# Patient Record
Sex: Female | Born: 1945 | Race: Black or African American | Hispanic: No | Marital: Married | Smoking: Never smoker
Health system: Southern US, Community
[De-identification: ages and names within clinical notes are randomized; demographics above are authoritative.]

## PROBLEM LIST (undated history)

## (undated) DIAGNOSIS — M1990 Unspecified osteoarthritis, unspecified site: Secondary | ICD-10-CM

## (undated) DIAGNOSIS — B009 Herpesviral infection, unspecified: Secondary | ICD-10-CM

## (undated) DIAGNOSIS — M8580 Other specified disorders of bone density and structure, unspecified site: Secondary | ICD-10-CM

## (undated) DIAGNOSIS — K219 Gastro-esophageal reflux disease without esophagitis: Secondary | ICD-10-CM

## (undated) DIAGNOSIS — T7840XA Allergy, unspecified, initial encounter: Secondary | ICD-10-CM

## (undated) DIAGNOSIS — R011 Cardiac murmur, unspecified: Secondary | ICD-10-CM

## (undated) DIAGNOSIS — H269 Unspecified cataract: Secondary | ICD-10-CM

## (undated) DIAGNOSIS — N870 Mild cervical dysplasia: Secondary | ICD-10-CM

## (undated) DIAGNOSIS — D219 Benign neoplasm of connective and other soft tissue, unspecified: Secondary | ICD-10-CM

## (undated) DIAGNOSIS — D649 Anemia, unspecified: Secondary | ICD-10-CM

## (undated) HISTORY — DX: Unspecified cataract: H26.9

## (undated) HISTORY — DX: Gastro-esophageal reflux disease without esophagitis: K21.9

## (undated) HISTORY — DX: Cardiac murmur, unspecified: R01.1

## (undated) HISTORY — DX: Allergy, unspecified, initial encounter: T78.40XA

## (undated) HISTORY — DX: Herpesviral infection, unspecified: B00.9

## (undated) HISTORY — DX: Mild cervical dysplasia: N87.0

## (undated) HISTORY — DX: Other specified disorders of bone density and structure, unspecified site: M85.80

## (undated) HISTORY — PX: GYNECOLOGIC CRYOSURGERY: SHX857

## (undated) HISTORY — PX: TUBAL LIGATION: SHX77

## (undated) HISTORY — DX: Anemia, unspecified: D64.9

## (undated) HISTORY — DX: Unspecified osteoarthritis, unspecified site: M19.90

## (undated) HISTORY — DX: Benign neoplasm of connective and other soft tissue, unspecified: D21.9

## (undated) HISTORY — PX: POLYPECTOMY: SHX149

## (undated) HISTORY — PX: ENDOMETRIAL ABLATION: SHX621

## (undated) HISTORY — PX: COLPOSCOPY: SHX161

## (undated) HISTORY — PX: COLONOSCOPY: SHX174

---

## 1991-02-06 HISTORY — PX: ABDOMINAL HYSTERECTOMY: SHX81

## 1998-04-16 ENCOUNTER — Other Ambulatory Visit: Admission: RE | Admit: 1998-04-16 | Discharge: 1998-04-16 | Payer: Self-pay | Admitting: Obstetrics and Gynecology

## 1999-05-20 ENCOUNTER — Other Ambulatory Visit: Admission: RE | Admit: 1999-05-20 | Discharge: 1999-05-20 | Payer: Self-pay | Admitting: Obstetrics and Gynecology

## 2000-08-14 ENCOUNTER — Encounter: Admission: RE | Admit: 2000-08-14 | Discharge: 2000-08-14 | Payer: Self-pay | Admitting: Oncology

## 2000-08-14 ENCOUNTER — Encounter (HOSPITAL_COMMUNITY): Admission: RE | Admit: 2000-08-14 | Discharge: 2000-09-13 | Payer: Self-pay | Admitting: Oncology

## 2000-11-28 ENCOUNTER — Encounter: Admission: RE | Admit: 2000-11-28 | Discharge: 2000-11-28 | Payer: Self-pay | Admitting: Oncology

## 2000-11-28 ENCOUNTER — Encounter (HOSPITAL_COMMUNITY): Admission: RE | Admit: 2000-11-28 | Discharge: 2000-12-28 | Payer: Self-pay | Admitting: Oncology

## 2000-12-14 ENCOUNTER — Other Ambulatory Visit: Admission: RE | Admit: 2000-12-14 | Discharge: 2000-12-14 | Payer: Self-pay | Admitting: Obstetrics and Gynecology

## 2001-07-02 ENCOUNTER — Encounter (HOSPITAL_COMMUNITY): Admission: RE | Admit: 2001-07-02 | Discharge: 2001-08-01 | Payer: Self-pay | Admitting: Oncology

## 2001-07-02 ENCOUNTER — Encounter: Admission: RE | Admit: 2001-07-02 | Discharge: 2001-07-02 | Payer: Self-pay | Admitting: Oncology

## 2002-01-20 ENCOUNTER — Other Ambulatory Visit: Admission: RE | Admit: 2002-01-20 | Discharge: 2002-01-20 | Payer: Self-pay | Admitting: Obstetrics and Gynecology

## 2002-04-28 ENCOUNTER — Encounter (HOSPITAL_COMMUNITY): Admission: RE | Admit: 2002-04-28 | Discharge: 2002-05-28 | Payer: Self-pay | Admitting: Oncology

## 2002-04-28 ENCOUNTER — Encounter: Admission: RE | Admit: 2002-04-28 | Discharge: 2002-04-28 | Payer: Self-pay | Admitting: Oncology

## 2002-10-13 ENCOUNTER — Ambulatory Visit (HOSPITAL_COMMUNITY): Admission: RE | Admit: 2002-10-13 | Discharge: 2002-10-13 | Payer: Self-pay | Admitting: Gastroenterology

## 2002-10-13 ENCOUNTER — Encounter (INDEPENDENT_AMBULATORY_CARE_PROVIDER_SITE_OTHER): Payer: Self-pay | Admitting: Specialist

## 2003-02-20 ENCOUNTER — Other Ambulatory Visit: Admission: RE | Admit: 2003-02-20 | Discharge: 2003-02-20 | Payer: Self-pay | Admitting: Obstetrics and Gynecology

## 2003-11-30 ENCOUNTER — Encounter: Admission: RE | Admit: 2003-11-30 | Discharge: 2003-11-30 | Payer: Self-pay | Admitting: Oncology

## 2003-11-30 ENCOUNTER — Encounter (HOSPITAL_COMMUNITY): Admission: RE | Admit: 2003-11-30 | Discharge: 2003-12-30 | Payer: Self-pay | Admitting: Oncology

## 2004-03-08 DIAGNOSIS — B009 Herpesviral infection, unspecified: Secondary | ICD-10-CM

## 2004-03-08 HISTORY — DX: Herpesviral infection, unspecified: B00.9

## 2004-04-18 ENCOUNTER — Other Ambulatory Visit: Admission: RE | Admit: 2004-04-18 | Discharge: 2004-04-18 | Payer: Self-pay | Admitting: Obstetrics and Gynecology

## 2005-05-05 ENCOUNTER — Other Ambulatory Visit: Admission: RE | Admit: 2005-05-05 | Discharge: 2005-05-05 | Payer: Self-pay | Admitting: Obstetrics and Gynecology

## 2006-06-29 ENCOUNTER — Other Ambulatory Visit: Admission: RE | Admit: 2006-06-29 | Discharge: 2006-06-29 | Payer: Self-pay | Admitting: Obstetrics and Gynecology

## 2007-07-26 ENCOUNTER — Other Ambulatory Visit: Admission: RE | Admit: 2007-07-26 | Discharge: 2007-07-26 | Payer: Self-pay | Admitting: Obstetrics and Gynecology

## 2009-06-08 ENCOUNTER — Ambulatory Visit: Payer: Self-pay | Admitting: Obstetrics and Gynecology

## 2009-06-08 ENCOUNTER — Other Ambulatory Visit: Admission: RE | Admit: 2009-06-08 | Discharge: 2009-06-08 | Payer: Self-pay | Admitting: Obstetrics and Gynecology

## 2009-08-10 ENCOUNTER — Ambulatory Visit: Payer: Self-pay | Admitting: Obstetrics and Gynecology

## 2009-08-26 ENCOUNTER — Emergency Department (HOSPITAL_COMMUNITY): Admission: EM | Admit: 2009-08-26 | Discharge: 2009-08-26 | Payer: Self-pay | Admitting: Emergency Medicine

## 2010-08-31 ENCOUNTER — Other Ambulatory Visit: Payer: Self-pay | Admitting: Obstetrics and Gynecology

## 2010-08-31 ENCOUNTER — Other Ambulatory Visit (HOSPITAL_COMMUNITY)
Admission: RE | Admit: 2010-08-31 | Discharge: 2010-08-31 | Disposition: A | Discharge status: Home / Self Care | Payer: BC Managed Care – PPO | Source: Ambulatory Visit | Attending: Obstetrics and Gynecology | Admitting: Obstetrics and Gynecology

## 2010-08-31 ENCOUNTER — Encounter (INDEPENDENT_AMBULATORY_CARE_PROVIDER_SITE_OTHER): Payer: BC Managed Care – PPO | Admitting: Obstetrics and Gynecology

## 2010-08-31 DIAGNOSIS — Z124 Encounter for screening for malignant neoplasm of cervix: Secondary | ICD-10-CM | POA: Insufficient documentation

## 2010-08-31 DIAGNOSIS — Z01419 Encounter for gynecological examination (general) (routine) without abnormal findings: Secondary | ICD-10-CM

## 2010-09-23 NOTE — Op Note (Signed)
   NAME:  Latasha Schmidt, Latasha Schmidt                          ACCOUNT NO.:  [id number]   MEDICAL RECORD NO.:  [id number]                   PATIENT TYPE:  AMB   LOCATION:  ENDO                                 FACILITY:  [facility]   PHYSICIAN:  Petra Kuba, M.D.                 DATE OF BIRTH:  03-Dec-1945   DATE OF PROCEDURE:  10/13/2002  DATE OF DISCHARGE:                                 OPERATIVE REPORT   PROCEDURE:  Colonoscopy with polypectomy.   INDICATIONS FOR PROCEDURE:  Screening with family history of colon cancer  and colon polyps.  Consent was signed after risks, benefits, methods, and  options were thoroughly discussed in the office.   PREMEDICATION:  Demerol 50 and Versed 5.   DESCRIPTION OF PROCEDURE:  Rectal inspection was pertinent for external  hemorrhoids.  Small digital examination was negative.  Video pediatric  adjustable colonoscope was inserted, fairly easily advanced around the colon  to the cecum.  This did require some abdominal pressure, but no position  changes.  No obvious abnormality was seen on insertion.  The cecum was  identified by the appendiceal orifice and the ileocecal valve.  Prep was  fairly adequate.  Did require some washing and suctioning for adequate  visualization.  On slow withdrawal through the colon, other than a tiny  small cecal and mid descending polyps, both of which were hot biopsied, no  other abnormalities were seen as we slowly withdrew back to the rectum.  Once back in the rectum, the scope was retroflexed pertinent for some  internal hemorrhoids.  Anal rectal palpation confirmed the above.  The scope  was straightened and readvanced a short ways up the left side of the colon.  Air was suctioned and the scope removed.  The patient tolerated the  procedure well. There was no obvious immediate complications.   ENDOSCOPIC ASSESSMENT:  1. Internal and external hemorrhoids.  2. Two tiny and small cecum and descending polyps hot biopsied.  3.  Otherwise within normal limits to the cecum.   PLAN:  Happy to see back p.r.n.  Return care to Dr. Christell Constant for the customary  health care maintenance to include yearly rectals and guaiacs.  Await  pathology.  Probably repeat colon screening in five years unless a surprise  on pathology.                                               Petra Kuba, M.D.    MEM/MEDQ  D:  10/13/2002  T:  10/13/2002  Job:  [id number]   cc:   Ernestina Penna, M.D.  [address] [place]  [place] [address]  Fax: [phone]

## 2010-10-21 ENCOUNTER — Ambulatory Visit (INDEPENDENT_AMBULATORY_CARE_PROVIDER_SITE_OTHER): Payer: BC Managed Care – PPO | Admitting: Women's Health

## 2010-10-21 DIAGNOSIS — B373 Candidiasis of vulva and vagina: Secondary | ICD-10-CM

## 2010-10-21 DIAGNOSIS — N898 Other specified noninflammatory disorders of vagina: Secondary | ICD-10-CM

## 2011-06-12 ENCOUNTER — Encounter: Payer: Self-pay | Admitting: Obstetrics and Gynecology

## 2011-06-22 ENCOUNTER — Ambulatory Visit (AMBULATORY_SURGERY_CENTER): Payer: BC Managed Care – PPO | Admitting: *Deleted

## 2011-06-22 ENCOUNTER — Telehealth: Payer: Self-pay | Admitting: *Deleted

## 2011-06-22 ENCOUNTER — Encounter: Payer: Self-pay | Admitting: Internal Medicine

## 2011-06-22 VITALS — Ht 62.0 in | Wt 129.9 lb

## 2011-06-22 DIAGNOSIS — Z1211 Encounter for screening for malignant neoplasm of colon: Secondary | ICD-10-CM

## 2011-06-22 MED ORDER — PEG-KCL-NACL-NASULF-NA ASC-C 100 G PO SOLR: ORAL | Status: DC

## 2011-06-22 NOTE — Progress Notes (Signed)
No allergy to eggs or soy products  Pt very anxious at her PV.  She states that she has severe asthma and had a bad experience with her uterine ablation.  Pt informed of Propofol and she is agreeable to this.  Appt changed to Dr. Lauro Franklin propofol day

## 2011-06-22 NOTE — Telephone Encounter (Signed)
Dicky Doe,  I put a release of information sheet on your computer for this pt.  She had her last colonoscopy with Dr. Ewing Schlein in 2004.  Her colonoscopy is scheduled for 07-05-11.  Thank you, Baxter Hire

## 2011-06-27 ENCOUNTER — Other Ambulatory Visit: Payer: BC Managed Care – PPO | Admitting: Internal Medicine

## 2011-07-05 ENCOUNTER — Encounter: Payer: Self-pay | Admitting: Internal Medicine

## 2011-07-05 ENCOUNTER — Ambulatory Visit (AMBULATORY_SURGERY_CENTER): Payer: BC Managed Care – PPO | Admitting: Internal Medicine

## 2011-07-05 VITALS — BP 137/70 | HR 85 | Temp 97.7°F | Resp 18 | Ht 62.0 in | Wt 129.0 lb

## 2011-07-05 DIAGNOSIS — Z1211 Encounter for screening for malignant neoplasm of colon: Secondary | ICD-10-CM

## 2011-07-05 DIAGNOSIS — Z860100 Personal history of colon polyps, unspecified: Secondary | ICD-10-CM

## 2011-07-05 MED ORDER — SODIUM CHLORIDE 0.9 % IV SOLN: 500.0000 mL | INTRAVENOUS | Status: DC

## 2011-07-05 NOTE — Progress Notes (Signed)
Patient did not experience any of the following events: a burn prior to discharge; a fall within the facility; wrong site/side/patient/procedure/implant event; or a hospital transfer or hospital admission upon discharge from the facility. (G8907) Patient did not have preoperative order for IV antibiotic SSI prophylaxis. (G8918)  

## 2011-07-05 NOTE — Patient Instructions (Signed)

## 2011-07-05 NOTE — Op Note (Signed)
[facility] [address] [place], [place]  [address]  COLONOSCOPY PROCEDURE REPORT  PATIENT:  Latasha Schmidt, Latasha Schmidt  MR#:  [id number] BIRTHDATE:  12/09/1945, 66 yrs. old  GENDER:  female ENDOSCOPIST:  Carie Caddy. Laretta Pyatt, MD REF. BY:  Paulita Cradle, N.P. PROCEDURE DATE:  07/05/2011 PROCEDURE:  Colonoscopy 62130 ASA CLASS:  Class II INDICATIONS:  surveillance and high-risk screening, history of colon polyps, last colonoscopy approximately 8 yrs ago MEDICATIONS:   MAC sedation, administered by CRNA, propofol (Diprivan) 200 mg IV  DESCRIPTION OF PROCEDURE:   After the risks benefits and alternatives of the procedure were thoroughly explained, informed consent was obtained.  Digital rectal exam was performed and revealed no rectal masses.   The LB [id number] endoscope was introduced through the anus and advanced to the cecum, which was identified by both the appendix and ileocecal valve, without limitations.  The quality of the prep was good, using MoviPrep. The instrument was then slowly withdrawn as the colon was fully examined. <<PROCEDUREIMAGES>>  FINDINGS:  A small lipoma was found in the descending colon.  This was otherwise a normal examination of the colon.   Retroflexed views in the rectum revealed no abnormalities.   The scope was then withdrawn  from the cecum and the procedure completed. COMPLICATIONS:  None  ENDOSCOPIC IMPRESSION: 1) Small lipoma in the descending colon. 2) Otherwise normal examination  RECOMMENDATIONS: 1) Given your personal history of adenomatous (pre-cancerous) polyps, you will need a repeat colonoscopy in 5 years.  Carie Caddy. Rhea Belton, MD  CC:  The Patient Paulita Cradle, NP  n. eSIGNED:   Carie Caddy. Jayleena Stille at 07/05/2011 12:06 PM  Eustace Quail, [phone]

## 2011-07-06 ENCOUNTER — Telehealth: Payer: Self-pay | Admitting: *Deleted

## 2011-07-06 NOTE — Telephone Encounter (Signed)
  Follow up Call-  Call back number 07/05/2011  Post procedure Call Back phone  # [phone]  Permission to leave phone message Yes     Patient questions:  Do you have a fever, pain , or abdominal swelling? yes Pain Score  2 *  Have you tolerated food without any problems? yes  Have you been able to return to your normal activities? yes  Do you have any questions about your discharge instructions: Diet   no Medications  no Follow up visit  no  Do you have questions or concerns about your Care? no  Actions: * If pain score is 4 or above: No action needed, pain <4.  Instructed pt to call back if pains worsens or if she develops any new symptoms.

## 2011-08-14 DIAGNOSIS — B009 Herpesviral infection, unspecified: Secondary | ICD-10-CM | POA: Insufficient documentation

## 2011-08-14 DIAGNOSIS — M8580 Other specified disorders of bone density and structure, unspecified site: Secondary | ICD-10-CM | POA: Insufficient documentation

## 2011-08-15 ENCOUNTER — Other Ambulatory Visit: Payer: Self-pay | Admitting: Obstetrics and Gynecology

## 2011-08-15 DIAGNOSIS — M8580 Other specified disorders of bone density and structure, unspecified site: Secondary | ICD-10-CM

## 2011-08-17 ENCOUNTER — Ambulatory Visit (INDEPENDENT_AMBULATORY_CARE_PROVIDER_SITE_OTHER): Payer: BC Managed Care – PPO

## 2011-08-17 DIAGNOSIS — M949 Disorder of cartilage, unspecified: Secondary | ICD-10-CM

## 2011-08-17 DIAGNOSIS — M8580 Other specified disorders of bone density and structure, unspecified site: Secondary | ICD-10-CM

## 2011-09-04 ENCOUNTER — Encounter: Payer: BC Managed Care – PPO | Admitting: Obstetrics and Gynecology

## 2011-09-21 ENCOUNTER — Encounter: Payer: Self-pay | Admitting: Obstetrics and Gynecology

## 2011-09-21 ENCOUNTER — Ambulatory Visit (INDEPENDENT_AMBULATORY_CARE_PROVIDER_SITE_OTHER): Payer: BC Managed Care – PPO | Admitting: Obstetrics and Gynecology

## 2011-09-21 VITALS — BP 120/80 | Ht 63.0 in | Wt 132.0 lb

## 2011-09-21 DIAGNOSIS — Z01419 Encounter for gynecological examination (general) (routine) without abnormal findings: Secondary | ICD-10-CM

## 2011-09-21 DIAGNOSIS — N870 Mild cervical dysplasia: Secondary | ICD-10-CM | POA: Insufficient documentation

## 2011-09-21 NOTE — Progress Notes (Signed)
Patient came to see me today for her annual GYN exam. She is doing fine without HRT. She is having no vaginal bleeding. She is having no pelvic pain or dyspareunia. 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 She had a normal colonoscopy this year. She scheduled her yearly mammogram. She does her lab through her PCP. She was just started on a statin. 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 She has had normal Pap smears since then and a normal cervix at the time of her hysterectomy. She just had a bone density through our office. She has low bone mass without an elevated FRAX risk. She takes calcium and vitamin D. She has had no fractures.  HEENT: Within normal limits. Latasha Schmidt present. Neck: No masses. Supraclavicular lymph nodes: Not enlarged. Breasts: Examined in both sitting and lying position. Symmetrical without skin changes or masses. Abdomen: Soft no masses guarding or rebound. No hernias. Pelvic: External within normal limits. BUS within normal limits. Vaginal examination shows good estrogen effect, no cystocele enterocele or rectocele. Cervix and uterus absent. Adnexa within normal limits. Rectovaginal confirmatory. Extremities within normal limits.  Assessment: #1. CIN-1 #2. Osteopenia  Plan: Mammogram

## 2011-09-22 LAB — URINALYSIS W MICROSCOPIC + REFLEX CULTURE
Casts: NONE SEEN
Crystals: NONE SEEN
Leukocytes, UA: NEGATIVE
Nitrite: NEGATIVE
Specific Gravity, Urine: >1.03 — ABNORMAL HIGH (ref 1.005–1.030)
pH: 5.5 (ref 5.0–8.0)

## 2011-10-05 ENCOUNTER — Encounter: Payer: Self-pay | Admitting: Obstetrics and Gynecology

## 2011-10-11 ENCOUNTER — Telehealth: Payer: Self-pay | Admitting: *Deleted

## 2011-10-11 MED ORDER — ACYCLOVIR 5 % EX OINT: TOPICAL_OINTMENT | CUTANEOUS | Status: DC

## 2011-10-11 MED ORDER — VALACYCLOVIR HCL 500 MG PO TABS: ORAL_TABLET | ORAL | Status: DC

## 2011-10-11 NOTE — Telephone Encounter (Signed)
Addended by: Aura Camps on: 10/11/2011 10:44 AM   Modules accepted: Orders

## 2011-10-11 NOTE — Telephone Encounter (Signed)
Pt calling c/o of possible HSV 1 outbreak, first outbreak in years. Pt said about 4 years ago you gave her rx for cream and pill to take. She would both this time as well, she also mentioned if her husband could take some medication? He is a diabetic and she is concerned about him. Please advise

## 2011-10-11 NOTE — Telephone Encounter (Signed)
Pt informed with the below note, rx sent to pharmacy. 

## 2011-10-11 NOTE — Telephone Encounter (Signed)
Given her Valtrex 500 mg twice a day for 5 days. You can also give her Zovirax ointment which she can rub on 4-5 times a day. Her husband's Dr. Laury Axon to be one to treat him.

## 2011-10-23 ENCOUNTER — Ambulatory Visit (INDEPENDENT_AMBULATORY_CARE_PROVIDER_SITE_OTHER): Payer: BC Managed Care – PPO | Admitting: Obstetrics and Gynecology

## 2011-10-23 ENCOUNTER — Encounter: Payer: Self-pay | Admitting: Obstetrics and Gynecology

## 2011-10-23 DIAGNOSIS — N9089 Other specified noninflammatory disorders of vulva and perineum: Secondary | ICD-10-CM

## 2011-10-23 DIAGNOSIS — B009 Herpesviral infection, unspecified: Secondary | ICD-10-CM

## 2011-10-23 MED ORDER — FLUCONAZOLE 150 MG PO TABS: 150.0000 mg | ORAL_TABLET | Freq: Once | ORAL | Status: AC

## 2011-10-23 NOTE — Progress Notes (Signed)
Patient came to see me today with her husband because a concern of giving him HSV -1. 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 We have treated her with Valtrex for one year and she never got a recurrence so we stopped it in 2006. She had no recurrences after stopping it but on June 5 of this year she developed a red lesion near her clitoris and thought it was herpes. We offered to see her but she declined an ask for treatment which we gave her in the form of Valtrex twice a day. She still has some evidence of a red bump and came in to see me to look at it and also to discuss what she and her husband should do to prevent him from getting herpes. They have been together 16 months and have always had unprotected sex and he has never had an outbreak nor does he have a history of herpes.  Exam: Latasha Schmidt present. External: The right side of her clitoris there is shows the slightest presence of what looks like folliculitis( 1-2 mm) but does not look like herpes. Vaginal exam: Within normal limits  Assessment: History of HSV 1. Small area folliculitis( 1-2 mm)  Plan: Patient reassured that this is not a herpetic outbreak. Diflucan called in when necessary. Discussed in detail with patient and her husband wanted transpired in 2005 and why I treated her for one year then. We discussed in detail whether she should go on suppressive Valtrex to prevent her from shedding virus so he would not get infected. We also discussed condoms. I told them that I thought it is extremely unlikely that she would give him herpes at this point but certainly there is no guarantee. They will think about it and she would like to go on daily Valtrex she will call for an prescription and will give her 500 mg daily.

## 2011-11-02 ENCOUNTER — Ambulatory Visit (INDEPENDENT_AMBULATORY_CARE_PROVIDER_SITE_OTHER): Payer: BC Managed Care – PPO | Admitting: Obstetrics and Gynecology

## 2011-11-02 DIAGNOSIS — N899 Noninflammatory disorder of vagina, unspecified: Secondary | ICD-10-CM

## 2011-11-02 DIAGNOSIS — R300 Dysuria: Secondary | ICD-10-CM

## 2011-11-02 DIAGNOSIS — N898 Other specified noninflammatory disorders of vagina: Secondary | ICD-10-CM

## 2011-11-02 LAB — URINALYSIS W MICROSCOPIC + REFLEX CULTURE
Casts: NONE SEEN
Crystals: NONE SEEN
Glucose, UA: NEGATIVE mg/dL
Hgb urine dipstick: NEGATIVE
Nitrite: NEGATIVE
Specific Gravity, Urine: 1.025 (ref 1.005–1.030)
pH: 5.5 (ref 5.0–8.0)

## 2011-11-02 LAB — WET PREP FOR TRICH, YEAST, CLUE

## 2011-11-02 MED ORDER — TERCONAZOLE 0.8 % VA CREA: 1.0000 | TOPICAL_CREAM | Freq: Every day | VAGINAL | Status: AC

## 2011-11-02 NOTE — Progress Notes (Signed)
Patient came to see me today with a 3 to four-day history of vulvar and vaginal burning, discharge and burning after urination. She has not been on antibiotics. She has had no vaginal bleeding.  Exam: Kennon Portela present. External: Within normal limits BUS: Within normal limits. The area of folliculitis seen at the last visit is now gone. Vaginal exam shows a typical yeast white discharge. Wet prep was negative except for white blood cells and bacteria. Urinalysis showed 3-6 white cells.  Assessment: Yeast vaginitis. Possible urinary tract infection.  Plan: Terconazole 3 cream-use externally and internally. Urine culture.

## 2011-11-05 LAB — URINE CULTURE: Colony Count: 40000

## 2011-11-06 ENCOUNTER — Other Ambulatory Visit: Payer: Self-pay | Admitting: Obstetrics and Gynecology

## 2011-12-06 ENCOUNTER — Other Ambulatory Visit: Payer: Self-pay | Admitting: Obstetrics and Gynecology

## 2012-07-22 DIAGNOSIS — J45909 Unspecified asthma, uncomplicated: Secondary | ICD-10-CM | POA: Diagnosis not present

## 2012-07-22 DIAGNOSIS — R0789 Other chest pain: Secondary | ICD-10-CM | POA: Diagnosis not present

## 2012-07-22 DIAGNOSIS — E785 Hyperlipidemia, unspecified: Secondary | ICD-10-CM | POA: Diagnosis not present

## 2012-07-22 DIAGNOSIS — R5381 Other malaise: Secondary | ICD-10-CM | POA: Diagnosis not present

## 2012-07-25 ENCOUNTER — Telehealth: Payer: Self-pay | Admitting: Nurse Practitioner

## 2012-07-25 NOTE — Telephone Encounter (Signed)
Insurance will pay for pravastatin sodiam 40 mg. Also  proair hfa 90 inhaler.  [facility] in [place]

## 2012-07-25 NOTE — Telephone Encounter (Signed)
Returning call on test results and she wants to talk about meds that ins want cover

## 2012-07-26 ENCOUNTER — Telehealth: Payer: Self-pay | Admitting: Nurse Practitioner

## 2012-07-26 DIAGNOSIS — H25019 Cortical age-related cataract, unspecified eye: Secondary | ICD-10-CM | POA: Diagnosis not present

## 2012-07-26 DIAGNOSIS — H524 Presbyopia: Secondary | ICD-10-CM | POA: Diagnosis not present

## 2012-07-26 DIAGNOSIS — H52229 Regular astigmatism, unspecified eye: Secondary | ICD-10-CM | POA: Diagnosis not present

## 2012-07-26 DIAGNOSIS — H5210 Myopia, unspecified eye: Secondary | ICD-10-CM | POA: Diagnosis not present

## 2012-07-26 NOTE — Telephone Encounter (Signed)
Rx for Proair called to [facility] in [place]. Pt notified

## 2012-07-26 NOTE — Telephone Encounter (Signed)
Patient called stating that her insurance will not cover the albuterol but they will cover the ProAir. She would like the ProAir to be called in to the [facility] in [place].

## 2012-07-29 ENCOUNTER — Other Ambulatory Visit: Payer: Self-pay | Admitting: Nurse Practitioner

## 2012-07-29 ENCOUNTER — Telehealth: Payer: Self-pay | Admitting: Nurse Practitioner

## 2012-07-29 ENCOUNTER — Encounter: Payer: Self-pay | Admitting: Nurse Practitioner

## 2012-07-29 MED ORDER — ALBUTEROL SULFATE HFA 108 (90 BASE) MCG/ACT IN AERS: 2.0000 | INHALATION_SPRAY | Freq: Four times a day (QID) | RESPIRATORY_TRACT | Status: DC | PRN

## 2012-07-29 NOTE — Telephone Encounter (Signed)
Insurance will not pay for allbuterol.. Wants proair. [facility] in [place]

## 2012-07-29 NOTE — Telephone Encounter (Signed)
Please advise 

## 2012-07-31 ENCOUNTER — Other Ambulatory Visit: Payer: Self-pay | Admitting: *Deleted

## 2012-07-31 ENCOUNTER — Encounter: Payer: Self-pay | Admitting: Nurse Practitioner

## 2012-07-31 NOTE — Addendum Note (Signed)
Addended by: Baltazar Apo on: 07/31/2012 10:25 AM   Modules accepted: Level of Service, SmartSet

## 2012-07-31 NOTE — Telephone Encounter (Signed)
  This encounter was created in error - please disregard.PT DECIDED NOT TO GE MEDICINE

## 2012-09-19 NOTE — Telephone Encounter (Signed)
rx sent to pharmacy on 07/25/12 per Bennie Pierini

## 2012-10-07 DIAGNOSIS — Z1231 Encounter for screening mammogram for malignant neoplasm of breast: Secondary | ICD-10-CM | POA: Diagnosis not present

## 2012-10-08 ENCOUNTER — Encounter: Payer: Self-pay | Admitting: Women's Health

## 2012-10-11 ENCOUNTER — Encounter: Payer: Self-pay | Admitting: Women's Health

## 2012-10-11 ENCOUNTER — Encounter: Payer: Self-pay | Admitting: Obstetrics and Gynecology

## 2012-10-11 ENCOUNTER — Ambulatory Visit (INDEPENDENT_AMBULATORY_CARE_PROVIDER_SITE_OTHER): Payer: Medicare Other | Admitting: Women's Health

## 2012-10-11 VITALS — BP 144/90 | Ht 63.75 in | Wt 138.0 lb

## 2012-10-11 DIAGNOSIS — B009 Herpesviral infection, unspecified: Secondary | ICD-10-CM

## 2012-10-11 MED ORDER — VALACYCLOVIR HCL 500 MG PO TABS: ORAL_TABLET | ORAL | Status: DC

## 2012-10-11 NOTE — Progress Notes (Signed)
Latasha Schmidt. Latasha Schmidt Jun 19, 1945 [id number]    History:    The patient presents for breast and pelvic exam. TAH in 89 for fibroids and menorrhagia, no HRT. history of CIN with cryo- in 67 with normal Paps after. Primary care manages anemia/asthma/hypercholesteremia. Started on a statin 2013. Normal mammogram history. DEXA 08/2011 - Osteopenia T score -1.9 at left femoral neck, FRAX 7.3%/1.1%, no fracture history. Negative colonoscopy 2013.   Past medical history, past surgical history, family history and social history were all reviewed and documented in the EPIC chart. Retired Science writer. Had been divorced for many years and remarried first husband. Mother colon cancer.  Exam:  Filed Vitals:   10/11/12 1037  BP: 144/90    General appearance:  Normal Head/Neck:  Normal, without cervical or supraclavicular adenopathy. Thyroid:  Symmetrical, normal in size, without palpable masses or nodularity. Respiratory  Effort:  Normal  Auscultation:  Clear without wheezing or rhonchi Cardiovascular  Auscultation:  Regular rate, without rubs, murmurs or gallops  Edema/varicosities:  Not grossly evident Abdominal  Soft,nontender, without masses, guarding or rebound.  Liver/spleen:  No organomegaly noted  Hernia:  None appreciated  Skin  Inspection:  Grossly normal  Palpation:  Grossly normal Neurologic/psychiatric  Orientation:  Normal with appropriate conversation.  Mood/affect:  Normal  Genitourinary    Breasts: Examined lying and sitting.     Right: Without masses, retractions, discharge or axillary adenopathy.     Left: Without masses, retractions, discharge or axillary adenopathy.   Inguinal/mons:  Normal without inguinal adenopathy  External genitalia:  Normal  BUS/Urethra/Skene's glands:  Normal  Bladder:  Normal  Vagina:  Normal  Cervix:  absent  Uterus: absent  Adnexa/parametria:     Rt: Without masses or tenderness.   Lt: Without masses or tenderness.  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 Vitamin D 2000 daily and calcium rich diet encouraged. SBE's, continue annual mammogram. Pneumovax and zostavac vaccines reviewed and encouraged. Valtrex 500 daily for suppression, reviewed may want to take twice daily for 3-5 days as needed. Reviewed blood pressure slightly elevated, instructed to check away from office and continues greater than 130/80 to followup with primary care.    Harrington Challenger WHNP, 1:06 PM 10/11/2012

## 2012-10-11 NOTE — Patient Instructions (Addendum)
Tdap, zostavac/shingles, pnuemonia vaccine Vit D 2000 daily  Health Recommendations for Postmenopausal Women Respected and ongoing research has looked at the most common causes of death, disability, and poor quality of life in postmenopausal women. The causes include heart disease, diseases of blood vessels, diabetes, depression, cancer, and bone loss (osteoporosis). Many things can be done to help lower the chances of developing these and other common problems: CARDIOVASCULAR DISEASE Heart Disease: A heart attack is a medical emergency. Know the signs and symptoms of a heart attack. Below are things women can do to reduce their risk for heart disease.   Do not smoke. If you smoke, quit.  Aim for a healthy weight. Being overweight causes many preventable deaths. Eat a healthy and balanced diet and drink an adequate amount of liquids.  Get moving. Make a commitment to be more physically active. Aim for 30 minutes of activity on most, if not all days of the week.  Eat for heart health. Choose a diet that is low in saturated fat and cholesterol and eliminate trans fat. Include whole grains, vegetables, and fruits. Read and understand the labels on food containers before buying.  Know your numbers. Ask your caregiver to check your blood pressure, cholesterol (total, HDL, LDL, triglycerides) and blood glucose. Work with your caregiver on improving your entire clinical picture.  High blood pressure. Limit or stop your table salt intake (try salt substitute and food seasonings). Avoid salty foods and drinks. Read labels on food containers before buying. Eating well and exercising can help control high blood pressure. STROKE  Stroke is a medical emergency. Stroke may be the result of a blood clot in a blood vessel in the brain or by a brain hemorrhage (bleeding). Know the signs and symptoms of a stroke. To lower the risk of developing a stroke:  Avoid fatty foods.  Quit smoking.  Control your  diabetes, blood pressure, and irregular heart rate. THROMBOPHLEBITIS (BLOOD CLOT) OF THE LEG  Becoming overweight and leading a stationary lifestyle may also contribute to developing blood clots. Controlling your diet and exercising will help lower the risk of developing blood clots. CANCER SCREENING  Breast Cancer: Take steps to reduce your risk of breast cancer.  You should practice "breast self-awareness." This means understanding the normal appearance and feel of your breasts and should include breast self-examination. Any changes detected, no matter how small, should be reported to your caregiver.  After age 71, you should have a clinical breast exam (CBE) every year.  Starting at age 60, you should consider having a mammogram (breast X-ray) every year.  If you have a family history of breast cancer, talk to your caregiver about genetic screening.  If you are at high risk for breast cancer, talk to your caregiver about having an MRI and a mammogram every year.  Intestinal or Stomach Cancer: Tests to consider are a rectal exam, fecal occult blood, sigmoidoscopy, and colonoscopy. Women who are high risk may need to be screened at an earlier age and more often.  Cervical Cancer:  Beginning at age 20, you should have a Pap test every 3 years as long as the past 3 Pap tests have been normal.  If you have had past treatment for cervical cancer or a condition that could lead to cancer, you need Pap tests and screening for cancer for at least 20 years after your treatment.  If you had a hysterectomy for a problem that was not cancer or a condition that could lead to  cancer, then you no longer need Pap tests.  If you are between ages 46 and 70, and you have had normal Pap tests going back 10 years, you no longer need Pap tests.  If Pap tests have been discontinued, risk factors (such as a new sexual partner) need to be reassessed to determine if screening should be resumed.  Some medical  problems can increase the chance of getting cervical cancer. In these cases, your caregiver may recommend more frequent screening and Pap tests.  Uterine Cancer: If you have vaginal bleeding after reaching menopause, you should notify your caregiver.  Ovarian cancer: Other than yearly pelvic exams, there are no reliable tests available to screen for ovarian cancer at this time except for yearly pelvic exams.  Lung Cancer: Yearly chest X-rays can detect lung cancer and should be done on high risk women, such as cigarette smokers and women with chronic lung disease (emphysema).  Skin Cancer: A complete body skin exam should be done at your yearly examination. Avoid overexposure to the sun and ultraviolet light lamps. Use a strong sun block cream when in the sun. All of these things are important in lowering the risk of skin cancer. MENOPAUSE Menopause Symptoms: Hormone therapy products are effective for treating symptoms associated with menopause:  Moderate to severe hot flashes.  Night sweats.  Mood swings.  Headaches.  Tiredness.  Loss of sex drive.  Insomnia.  Other symptoms. Hormone replacement carries certain risks, especially in older women. Women who use or are thinking about using estrogen or estrogen with progestin treatments should discuss that with their caregiver. Your caregiver will help you understand the benefits and risks. The ideal dose of hormone replacement therapy is not known. The Food and Drug Administration (FDA) has concluded that hormone therapy should be used only at the lowest doses and for the shortest amount of time to reach treatment goals.  OSTEOPOROSIS Protecting Against Bone Loss and Preventing Fracture: If you use hormone therapy for prevention of bone loss (osteoporosis), the risks for bone loss must outweigh the risk of the therapy. Ask your caregiver about other medications known to be safe and effective for preventing bone loss and fractures. To guard  against bone loss or fractures, the following is recommended:  If you are less than age 43, take 1000 mg of calcium and at least 600 mg of Vitamin D per day.  If you are greater than age 7 but less than age 84, take 1200 mg of calcium and at least 600 mg of Vitamin D per day.  If you are greater than age 43, take 1200 mg of calcium and at least 800 mg of Vitamin D per day. Smoking and excessive alcohol intake increases the risk of osteoporosis. Eat foods rich in calcium and vitamin D and do weight bearing exercises several times a week as your caregiver suggests. DIABETES Diabetes Melitus: If you have Type I or Type 2 diabetes, you should keep your blood sugar under control with diet, exercise and recommended medication. Avoid too many sweets, starchy and fatty foods. Being overweight can make control more difficult. COGNITION AND MEMORY Cognition and Memory: Menopausal hormone therapy is not recommended for the prevention of cognitive disorders such as Alzheimer's disease or memory loss.  DEPRESSION  Depression may occur at any age, but is common in elderly women. The reasons may be because of physical, medical, social (loneliness), or financial problems and needs. If you are experiencing depression because of medical problems and control of symptoms,  talk to your caregiver about this. Physical activity and exercise may help with mood and sleep. Community and volunteer involvement may help your sense of value and worth. If you have depression and you feel that the problem is getting worse or becoming severe, talk to your caregiver about treatment options that are best for you. ACCIDENTS  Accidents are common and can be serious in the elderly woman. Prepare your house to prevent accidents. Eliminate throw rugs, place hand bars in the bath, shower and toilet areas. Avoid wearing high heeled shoes or walking on wet, snowy, and icy areas. Limit or stop driving if you have vision or hearing problems, or  you feel you are unsteady with you movements and reflexes. HEPATITIS C Hepatitis C is a type of viral infection affecting the liver. It is spread mainly through contact with blood from an infected person. It can be treated, but if left untreated, it can lead to severe liver damage over years. Many people who are infected do not know that the virus is in their blood. If you are a "baby-boomer", it is recommended that you have one screening test for Hepatitis C. IMMUNIZATIONS  Several immunizations are important to consider having during your senior years, including:   Tetanus, diptheria, and pertussis booster shot.  Influenza every year before the flu season begins.  Pneumonia vaccine.  Shingles vaccine.  Others as indicated based on your specific needs. Talk to your caregiver about these. Document Released: 06/16/2005 Document Revised: 04/10/2012 Document Reviewed: 02/10/2008 [facility] Patient Information 2014 [place], [place].

## 2012-10-21 ENCOUNTER — Encounter: Payer: Self-pay | Admitting: Obstetrics and Gynecology

## 2012-11-06 ENCOUNTER — Other Ambulatory Visit: Payer: Self-pay | Admitting: Nurse Practitioner

## 2013-02-19 ENCOUNTER — Telehealth: Payer: Self-pay | Admitting: Nurse Practitioner

## 2013-02-20 NOTE — Telephone Encounter (Signed)
Okay to wait until get labs back to get refill

## 2013-02-21 MED ORDER — PRAVASTATIN SODIUM 40 MG PO TABS: 40.0000 mg | ORAL_TABLET | Freq: Every day | ORAL | Status: DC

## 2013-02-21 NOTE — Telephone Encounter (Signed)
Will refill X1

## 2013-02-21 NOTE — Telephone Encounter (Signed)
She has not had her lab work yet want have it till the end of november

## 2013-03-14 ENCOUNTER — Ambulatory Visit: Payer: Medicare Other | Admitting: Nurse Practitioner

## 2013-03-21 ENCOUNTER — Encounter: Payer: Self-pay | Admitting: Nurse Practitioner

## 2013-03-21 ENCOUNTER — Ambulatory Visit (INDEPENDENT_AMBULATORY_CARE_PROVIDER_SITE_OTHER): Payer: Medicare Other | Admitting: Nurse Practitioner

## 2013-03-21 ENCOUNTER — Encounter (INDEPENDENT_AMBULATORY_CARE_PROVIDER_SITE_OTHER): Payer: Self-pay

## 2013-03-21 ENCOUNTER — Telehealth: Payer: Self-pay | Admitting: Family Medicine

## 2013-03-21 VITALS — BP 132/72 | HR 68 | Temp 98.6°F | Ht 63.75 in | Wt 125.3 lb

## 2013-03-21 DIAGNOSIS — J45909 Unspecified asthma, uncomplicated: Secondary | ICD-10-CM | POA: Insufficient documentation

## 2013-03-21 DIAGNOSIS — K219 Gastro-esophageal reflux disease without esophagitis: Secondary | ICD-10-CM | POA: Insufficient documentation

## 2013-03-21 DIAGNOSIS — E785 Hyperlipidemia, unspecified: Secondary | ICD-10-CM | POA: Diagnosis not present

## 2013-03-21 LAB — POCT CBC
HCT, POC: 37.9 % (ref 37.7–47.9)
Hemoglobin: 12 g/dL — AB (ref 12.2–16.2)
Lymph, poc: 1.6 (ref 0.6–3.4)
MCH, POC: 26.9 pg — AB (ref 27–31.2)
MCHC: 31.8 g/dL (ref 31.8–35.4)
MCV: 84.8 fL (ref 80–97)
POC LYMPH PERCENT: 39.8 %L (ref 10–50)
Platelet Count, POC: 233 10*3/uL (ref 142–424)
RBC: 4.5 M/uL (ref 4.04–5.48)

## 2013-03-21 MED ORDER — ALBUTEROL SULFATE HFA 108 (90 BASE) MCG/ACT IN AERS: 2.0000 | INHALATION_SPRAY | Freq: Four times a day (QID) | RESPIRATORY_TRACT | Status: DC | PRN

## 2013-03-21 MED ORDER — FLUTICASONE-SALMETEROL 100-50 MCG/DOSE IN AEPB: 1.0000 | INHALATION_SPRAY | Freq: Two times a day (BID) | RESPIRATORY_TRACT | Status: DC

## 2013-03-21 NOTE — Telephone Encounter (Signed)
done

## 2013-03-21 NOTE — Patient Instructions (Signed)

## 2013-03-21 NOTE — Telephone Encounter (Signed)
  Please res[facility] RX to [facility] [place] they [facility] [place] i changed the pharmacy in the computer

## 2013-03-21 NOTE — Progress Notes (Signed)
Subjective:    Patient ID: Latasha Schmidt. Wilner, female    DOB: March 28, 1946, 67 y.o.   MRN: [id number]  HPI  Patient here today for follow up- she is doing well- her cholesterol was elevated at last check and patient is here to have rechecked. She has been trying to watch her diet. Patient Active Problem List   Diagnosis Date Noted  . GERD (gastroesophageal reflux disease) 03/21/2013  . Intrinsic asthma 03/21/2013  . CIN I (cervical intraepithelial neoplasia I)   . HSV-1 (herpes simplex virus 1) infection   . Osteopenia    Outpatient Encounter Prescriptions as of 03/21/2013  Medication Sig  . ADVAIR DISKUS 100-50 MCG/DOSE AEPB as needed.  Marland Kitchen albuterol (PROAIR HFA) 108 (90 BASE) MCG/ACT inhaler Inhale 2 puffs into the lungs every 6 (six) hours as needed for wheezing.  . Ascorbic Acid (VITAMIN C PO) Take by mouth as needed.  Marland Kitchen BIOTIN PO Take by mouth.  Marland Kitchen CALCIUM PO Take by mouth daily.  . Cholecalciferol (VITAMIN D PO) Take 1 tablet by mouth daily.  Tery Sanfilippo Calcium (STOOL SOFTENER PO) Take by mouth as needed.  . Famotidine (PEPCID PO) Take by mouth as needed.  . Multiple Vitamins-Minerals (CENTRUM SILVER PO) Take 1 tablet by mouth daily.  . peg 3350 powder (MOVIPREP) 100 G SOLR Moviprep as directed  . pravastatin (PRAVACHOL) 40 MG tablet Take 1 tablet (40 mg total) by mouth daily.  . valACYclovir (VALTREX) 500 MG tablet TAKE 1 TABLET BY TWICE DAILY FOR 5 DAYS  . VITAMIN E PO Take by mouth daily.  . [DISCONTINUED] acyclovir ointment (ZOVIRAX) 5 % Apply tropically 4-5 times a day.       Review of Systems  Constitutional: Negative.   HENT: Negative.   Eyes: Negative.   Respiratory: Negative.   Cardiovascular: Negative.   Gastrointestinal: Negative.   Endocrine: Negative.   Genitourinary: Negative.   Musculoskeletal: Negative.   Allergic/Immunologic: Negative.   Neurological: Negative.   Hematological: Negative.   Psychiatric/Behavioral: Negative.        Objective:   Physical Exam  Constitutional: She is oriented to person, place, and time. She appears well-developed and well-nourished.  HENT:  Nose: Nose normal.  Mouth/Throat: Oropharynx is clear and moist.  Eyes: EOM are normal.  Neck: Trachea normal, normal range of motion and full passive range of motion without pain. Neck supple. No JVD present. Carotid bruit is not present. No thyromegaly present.  Cardiovascular: Normal rate, regular rhythm, normal heart sounds and intact distal pulses.  Exam reveals no gallop and no friction rub.   No murmur heard. Pulmonary/Chest: Effort normal and breath sounds normal.  Abdominal: Soft. Bowel sounds are normal. She exhibits no distension and no mass. There is no tenderness.  Musculoskeletal: Normal range of motion.  Lymphadenopathy:    She has no cervical adenopathy.  Neurological: She is alert and oriented to person, place, and time. She has normal reflexes.  Skin: Skin is warm and dry.  Psychiatric: She has a normal mood and affect. Her behavior is normal. Judgment and thought content normal.   BP 132/72  Pulse 68  Temp(Src) 98.6 F (37 C) (Oral)  Ht 5' 3.75" (1.619 m)  Wt 125 lb 4.8 oz (56.836 kg)  BMI 21.68 kg/m2        Assessment & Plan:   1. GERD (gastroesophageal reflux disease)   2. Intrinsic asthma   3. Hyperlipidemia LDL goal < 100    Orders Placed This Encounter  Procedures  .  CMP14+EGFR  . NMR, lipoprofile  . POCT CBC   Meds ordered this encounter  Medications  . Fluticasone-Salmeterol (ADVAIR DISKUS) 100-50 MCG/DOSE AEPB    Sig: Inhale 1 puff into the lungs 2 (two) times daily.    Dispense:  60 each    Refill:  5  . albuterol (PROAIR HFA) 108 (90 BASE) MCG/ACT inhaler    Sig: Inhale 2 puffs into the lungs every 6 (six) hours as needed for wheezing.    Dispense:  1 Inhaler    Refill:  1    Order Specific Question:  Supervising Provider    Answer:  Deborra Medina    Continue all meds Labs pending Diet and  exercise encouraged Health maintenance reviewed Follow up in 3 months  Mary-Margaret Daphine Deutscher, FNP

## 2013-03-22 LAB — NMR, LIPOPROFILE
HDL Particle Number: 46.4 umol/L (ref >=30.5)
LDL Particle Number: 866 nmol/L (ref <1000)
LDL Size: 21.9 nm (ref >20.5)
LDLC SERPL CALC-MCNC: 96 mg/dL (ref <100)
LP-IR Score: <25 (ref <=45)
Small LDL Particle Number: 200 nmol/L (ref <=527)
Triglycerides by NMR: 100 mg/dL (ref <150)

## 2013-03-22 LAB — CMP14+EGFR
AST: 18 IU/L (ref 0–40)
Albumin/Globulin Ratio: 2.1 (ref 1.1–2.5)
Alkaline Phosphatase: 57 IU/L (ref 39–117)
BUN/Creatinine Ratio: 18 (ref 11–26)
BUN: 12 mg/dL (ref 8–27)
CO2: 28 mmol/L (ref 18–29)
Creatinine, Ser: 0.67 mg/dL (ref 0.57–1.00)
GFR calc Af Amer: 107 mL/min/{1.73_m2} (ref >59)
GFR calc non Af Amer: 93 mL/min/{1.73_m2} (ref >59)
Globulin, Total: 2.1 g/dL (ref 1.5–4.5)
Potassium: 4.6 mmol/L (ref 3.5–5.2)
Sodium: 139 mmol/L (ref 134–144)
Total Bilirubin: 0.5 mg/dL (ref 0.0–1.2)

## 2013-04-01 ENCOUNTER — Telehealth: Payer: Self-pay | Admitting: Nurse Practitioner

## 2013-04-16 ENCOUNTER — Ambulatory Visit (INDEPENDENT_AMBULATORY_CARE_PROVIDER_SITE_OTHER): Payer: Medicare Other | Admitting: Family Medicine

## 2013-04-16 ENCOUNTER — Encounter: Payer: Self-pay | Admitting: Family Medicine

## 2013-04-16 VITALS — BP 143/69 | HR 67 | Temp 98.8°F | Ht 63.5 in | Wt 125.0 lb

## 2013-04-16 DIAGNOSIS — J069 Acute upper respiratory infection, unspecified: Secondary | ICD-10-CM

## 2013-04-16 DIAGNOSIS — J45909 Unspecified asthma, uncomplicated: Secondary | ICD-10-CM | POA: Diagnosis not present

## 2013-04-16 MED ORDER — DOXYCYCLINE HYCLATE 100 MG PO CAPS: 100.0000 mg | ORAL_CAPSULE | Freq: Two times a day (BID) | ORAL | Status: DC

## 2013-04-16 MED ORDER — PREDNISONE 50 MG PO TABS: ORAL_TABLET | ORAL | Status: DC

## 2013-04-16 NOTE — Progress Notes (Signed)
   Subjective:    Patient ID: Latasha Schmidt. Peake, female    DOB: Sep 14, 1945, 67 y.o.   MRN: [id number]  HPI URI Symptoms Onset: 3-4 days  Description: rhinorrhea, nasal congestion, sinus pressure, cough  Modifying factors:  Baseline asthmatic, no wheezing   Symptoms Nasal discharge: yes Fever: no Sore throat: no Cough: yes Wheezing: no Ear pain: no GI symptoms: no Sick contacts: yes  Red Flags  Stiff neck: no Dyspnea: no Rash: no Swallowing difficulty: no  Sinusitis Risk Factors Headache/face pain: yes Double sickening: no tooth pain: yes  Allergy Risk Factors Sneezing: no Itchy scratchy throat: no Seasonal symptoms: no  Flu Risk Factors Headache: no muscle aches: n severe fatigue: no     Review of Systems  All other systems reviewed and are negative.       Objective:   Physical Exam  Constitutional: She appears well-developed and well-nourished.  HENT:  Head: Normocephalic and atraumatic.  Right Ear: External ear normal.  Left Ear: External ear normal.  +nasal erythema, rhinorrhea bilaterally, + post oropharyngeal erythema  No maxillary/frontal sinus TTP    Eyes: Conjunctivae are normal. Pupils are equal, round, and reactive to light.  Neck: Normal range of motion. Neck supple.  Cardiovascular: Normal rate and regular rhythm.   Pulmonary/Chest: Effort normal and breath sounds normal. She has no wheezes.  Abdominal: Soft.  Musculoskeletal: Normal range of motion.  Neurological: She is alert.  Skin: Skin is warm.          Assessment & Plan:  URI (upper respiratory infection) - Plan: doxycycline (VIBRAMYCIN) 100 MG capsule  Unspecified asthma(493.90) - Plan: predniSONE (DELTASONE) 50 MG tablet  Sxs likely viral in etiology Discussed supportive care and infectious/resp red flags.  Will give ppx rx for doxy and prednisone of sxs fail to improve over next 7-10 days or worsen. Follow up as needed.

## 2013-05-26 ENCOUNTER — Other Ambulatory Visit: Payer: Self-pay | Admitting: Nurse Practitioner

## 2013-10-28 ENCOUNTER — Other Ambulatory Visit: Payer: Self-pay | Admitting: Nurse Practitioner

## 2013-11-05 DIAGNOSIS — M8580 Other specified disorders of bone density and structure, unspecified site: Secondary | ICD-10-CM

## 2013-11-05 HISTORY — DX: Other specified disorders of bone density and structure, unspecified site: M85.80

## 2013-11-20 ENCOUNTER — Encounter: Payer: Self-pay | Admitting: Women's Health

## 2013-11-20 ENCOUNTER — Ambulatory Visit (INDEPENDENT_AMBULATORY_CARE_PROVIDER_SITE_OTHER): Payer: Medicare Other | Admitting: Women's Health

## 2013-11-20 VITALS — BP 118/76 | Ht 63.0 in | Wt 128.0 lb

## 2013-11-20 DIAGNOSIS — M899 Disorder of bone, unspecified: Secondary | ICD-10-CM

## 2013-11-20 DIAGNOSIS — Z1231 Encounter for screening mammogram for malignant neoplasm of breast: Secondary | ICD-10-CM | POA: Diagnosis not present

## 2013-11-20 DIAGNOSIS — M8580 Other specified disorders of bone density and structure, unspecified site: Secondary | ICD-10-CM

## 2013-11-20 DIAGNOSIS — Z803 Family history of malignant neoplasm of breast: Secondary | ICD-10-CM | POA: Diagnosis not present

## 2013-11-20 DIAGNOSIS — M25559 Pain in unspecified hip: Secondary | ICD-10-CM | POA: Diagnosis not present

## 2013-11-20 DIAGNOSIS — M949 Disorder of cartilage, unspecified: Secondary | ICD-10-CM

## 2013-11-20 DIAGNOSIS — M25552 Pain in left hip: Secondary | ICD-10-CM

## 2013-11-20 MED ORDER — IBUPROFEN 600 MG PO TABS: 600.0000 mg | ORAL_TABLET | Freq: Three times a day (TID) | ORAL | Status: AC | PRN

## 2013-11-20 NOTE — Progress Notes (Signed)
Latasha Schmidt. Ruegg August 01, 1945 [id number]    History:    Presents for breast and pelvic exam 1992 TAH for fibroids and menorrhagia on no HRT. 1985 CIN-1 with cryo- with normal Paps after. Negative colonoscopy 2013. Normal mammogram history. DEXA 08/2011 -1.9 femoral neck FRAX 7.3%/1.1%. Complained left-sided back pain that radiates towards abdomen, initially was unable to stand up straight, pain is less.  Past medical history, past surgical history, family history and social history were all reviewed and documented in the EPIC chart. 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 Divorce for many years and has remarried her first husband several years ago. Caring for 38-month-old granddaughter 5 days a week and 12-year-old grandson  2 days a week/has lost 10 pounds in the past year.  ROS:  A  12 point ROS was performed and pertinent positives and negatives are included.  Exam:  Filed Vitals:   11/20/13 0932  BP: 118/76    General appearance:  Normal Thyroid:  Symmetrical, normal in size, without palpable masses or nodularity. Respiratory  Auscultation:  Clear without wheezing or rhonchi Cardiovascular  Auscultation:  Regular rate, without rubs, murmurs or gallops  Edema/varicosities:  Not grossly evident Abdominal  Soft,nontender, without masses, guarding or rebound.  Liver/spleen:  No organomegaly noted  Hernia:  None appreciated  Skin  Inspection:  Grossly normal   Breasts: Examined lying and sitting.     Right: Without masses, retractions, discharge or axillary adenopathy.     Left: Without masses, retractions, discharge or axillary adenopathy. Gentitourinary   Inguinal/mons:  Normal without inguinal adenopathy  External genitalia:  Normal  BUS/Urethra/Skene's glands:  Normal  Vagina:  Normal  Cervix:  Absent Uterus: Absent  Adnexa/parametria:     Rt: Without masses or tenderness.   Lt: Without masses or tenderness.  Anus and perineum: Normal  Digital rectal exam: Normal sphincter tone  without palpated masses or tenderness  Assessment/Plan:  68 y.o. MBF G2P2 for breast and pelvic exam.  1992 TAH for fibroids and menorrhagia on no HRT Osteopenia Left-sided back strain < 1wk HSV-no outbreaks in years Hyperlipidemia-primary care manages labs and meds  Plan: Motrin 600 every 8 hours as needed for back pain, rest, if persists after one week followup with primary care. SBE's, continue annual mammogram, calcium rich diet, vitamin D 2000 daily encouraged. Repeat DEXA, and the safety and fall prevention discussed. Encouraged to have grandchildren climb more and avoid lifting.  Pap, new screening guidelines reviewed. Impaction Zostavax recommended, will followup with primary care.  Note: This dictation was prepared with Dragon/digital dictation.  Any transcriptional errors that result are unintentional. Huel Cote [facility], 10:22 AM 11/20/2013

## 2013-11-21 ENCOUNTER — Encounter: Payer: Self-pay | Admitting: Women's Health

## 2013-11-25 ENCOUNTER — Other Ambulatory Visit: Payer: Self-pay | Admitting: Gynecology

## 2013-11-25 DIAGNOSIS — M8580 Other specified disorders of bone density and structure, unspecified site: Secondary | ICD-10-CM

## 2013-12-02 ENCOUNTER — Ambulatory Visit (INDEPENDENT_AMBULATORY_CARE_PROVIDER_SITE_OTHER): Payer: Medicare Other

## 2013-12-02 DIAGNOSIS — M8580 Other specified disorders of bone density and structure, unspecified site: Secondary | ICD-10-CM

## 2013-12-02 DIAGNOSIS — M899 Disorder of bone, unspecified: Secondary | ICD-10-CM

## 2013-12-02 DIAGNOSIS — M949 Disorder of cartilage, unspecified: Secondary | ICD-10-CM

## 2013-12-03 ENCOUNTER — Encounter: Payer: Self-pay | Admitting: Gynecology

## 2013-12-03 ENCOUNTER — Other Ambulatory Visit: Payer: Self-pay | Admitting: *Deleted

## 2013-12-03 DIAGNOSIS — M8580 Other specified disorders of bone density and structure, unspecified site: Secondary | ICD-10-CM

## 2013-12-05 ENCOUNTER — Other Ambulatory Visit: Payer: Self-pay | Admitting: Nurse Practitioner

## 2013-12-11 ENCOUNTER — Other Ambulatory Visit: Payer: Self-pay | Admitting: Nurse Practitioner

## 2013-12-15 NOTE — Telephone Encounter (Signed)
Patient last labs in 11-14. Was to return in 3 months for follow up . Please advise on refill

## 2013-12-15 NOTE — Telephone Encounter (Signed)
No refill until seen

## 2013-12-16 ENCOUNTER — Other Ambulatory Visit: Payer: Self-pay | Admitting: Nurse Practitioner

## 2013-12-17 NOTE — Telephone Encounter (Signed)
Pravastatin refill denied-Patient NTBS for follow up and lab work

## 2013-12-17 NOTE — Telephone Encounter (Signed)
Last labs done 11-14. Please advise on refill

## 2014-03-09 ENCOUNTER — Encounter: Payer: Self-pay | Admitting: Gynecology

## 2014-05-03 DIAGNOSIS — B9689 Other specified bacterial agents as the cause of diseases classified elsewhere: Secondary | ICD-10-CM | POA: Diagnosis not present

## 2014-05-03 DIAGNOSIS — J038 Acute tonsillitis due to other specified organisms: Secondary | ICD-10-CM | POA: Diagnosis not present

## 2014-07-07 ENCOUNTER — Encounter: Payer: Self-pay | Admitting: Women's Health

## 2014-07-07 ENCOUNTER — Ambulatory Visit (INDEPENDENT_AMBULATORY_CARE_PROVIDER_SITE_OTHER): Payer: Medicare Other | Admitting: Women's Health

## 2014-07-07 VITALS — BP 160/86 | Wt 129.0 lb

## 2014-07-07 DIAGNOSIS — N898 Other specified noninflammatory disorders of vagina: Secondary | ICD-10-CM | POA: Diagnosis not present

## 2014-07-07 DIAGNOSIS — B009 Herpesviral infection, unspecified: Secondary | ICD-10-CM | POA: Diagnosis not present

## 2014-07-07 DIAGNOSIS — R350 Frequency of micturition: Secondary | ICD-10-CM | POA: Diagnosis not present

## 2014-07-07 LAB — URINALYSIS W MICROSCOPIC + REFLEX CULTURE
BILIRUBIN URINE: NEGATIVE
Glucose, UA: NEGATIVE mg/dL
HGB URINE DIPSTICK: NEGATIVE
KETONES UR: NEGATIVE mg/dL
Leukocytes, UA: NEGATIVE
Nitrite: NEGATIVE
PH: 5 (ref 5.0–8.0)
Protein, ur: NEGATIVE mg/dL
Specific Gravity, Urine: 1.02 (ref 1.005–1.030)
Urobilinogen, UA: 0.2 mg/dL (ref 0.0–1.0)

## 2014-07-07 LAB — WET PREP FOR TRICH, YEAST, CLUE
CLUE CELLS WET PREP: NONE SEEN
TRICH WET PREP: NONE SEEN
Yeast Wet Prep HPF POC: NONE SEEN

## 2014-07-07 MED ORDER — VALACYCLOVIR HCL 500 MG PO TABS: ORAL_TABLET | ORAL | Status: DC

## 2014-07-07 NOTE — Progress Notes (Signed)
Patient ID: Latasha Schmidt. Sebree, female   DOB: 17-Apr-1946, 69 y.o.   MRN: [id number] Presents with complaint of increased urinary frequency, vaginal irritation, questionable HSV outbreak. Has taken Valtrex for the last few days. Requests a new Rx. Questions if she needs another Pap. History of CIN-1 with many years of normal Paps/hysterectomy. Last Pap 2012, normal. Helps care for her grandchildren.  Exam: Appears well. External genitalia with no visible discharge, lesions, irritation, or odor noted. Wet prep negative. UA negative.  Normal exam History of HSV  Plan: Valtrex 500 twice daily for 3-5 days as needed. Reviewed normality of exam and test results. Blood pressure elevated, instructed to check away from office and if it continues greater than 130/80 to follow-up with primary care. New Pap screening guidelines reviewed.

## 2014-08-13 ENCOUNTER — Ambulatory Visit (INDEPENDENT_AMBULATORY_CARE_PROVIDER_SITE_OTHER): Payer: Medicare Other | Admitting: Nurse Practitioner

## 2014-08-13 ENCOUNTER — Encounter: Payer: Self-pay | Admitting: Nurse Practitioner

## 2014-08-13 VITALS — BP 136/88 | HR 68 | Temp 97.7°F | Ht 63.0 in | Wt 128.0 lb

## 2014-08-13 DIAGNOSIS — K219 Gastro-esophageal reflux disease without esophagitis: Secondary | ICD-10-CM

## 2014-08-13 DIAGNOSIS — J4520 Mild intermittent asthma, uncomplicated: Secondary | ICD-10-CM

## 2014-08-13 DIAGNOSIS — E785 Hyperlipidemia, unspecified: Secondary | ICD-10-CM

## 2014-08-13 DIAGNOSIS — B009 Herpesviral infection, unspecified: Secondary | ICD-10-CM | POA: Diagnosis not present

## 2014-08-13 DIAGNOSIS — Z862 Personal history of diseases of the blood and blood-forming organs and certain disorders involving the immune mechanism: Secondary | ICD-10-CM

## 2014-08-13 MED ORDER — FAMOTIDINE 40 MG PO TABS: 40.0000 mg | ORAL_TABLET | ORAL | Status: DC | PRN

## 2014-08-13 MED ORDER — FLUTICASONE-SALMETEROL 100-50 MCG/DOSE IN AEPB: 1.0000 | INHALATION_SPRAY | Freq: Two times a day (BID) | RESPIRATORY_TRACT | Status: DC

## 2014-08-13 NOTE — Addendum Note (Signed)
Addended by: Chevis Pretty on: 08/13/2014 02:42 PM   Modules accepted: Orders

## 2014-08-13 NOTE — Progress Notes (Signed)
   Subjective:    Patient ID: Latasha Schmidt. Valenta, female    DOB: 23-Jan-1946, 69 y.o.   MRN: [id number]  Hyperlipidemia This is a chronic problem. The problem is controlled. Recent lipid tests were reviewed and are normal. She has no history of diabetes, hypothyroidism or obesity. She is currently on no antihyperlipidemic treatment. The current treatment provides no improvement of lipids. Compliance problems include adherence to diet and adherence to exercise.  Risk factors for coronary artery disease include dyslipidemia and family history.  HSV Valtrex daily- no recent outbreaks GERD pepcid daily Allergy induced asthma Needs refill on advair- had to use albuterol the other day because she became SOB.   Review of Systems  Constitutional: Negative.   HENT: Negative.   Respiratory: Negative.   Cardiovascular: Negative.   Gastrointestinal: Negative.   Genitourinary: Negative.   Neurological: Negative.   Psychiatric/Behavioral: Negative.   All other systems reviewed and are negative.      Objective:   Physical Exam  Constitutional: She is oriented to person, place, and time. She appears well-developed and well-nourished.  HENT:  Nose: Nose normal.  Mouth/Throat: Oropharynx is clear and moist.  Eyes: EOM are normal.  Neck: Trachea normal, normal range of motion and full passive range of motion without pain. Neck supple. No JVD present. Carotid bruit is not present. No thyromegaly present.  Cardiovascular: Normal rate, regular rhythm, normal heart sounds and intact distal pulses.  Exam reveals no gallop and no friction rub.   No murmur heard. Pulmonary/Chest: Effort normal and breath sounds normal.  Abdominal: Soft. Bowel sounds are normal. She exhibits no distension and no mass. There is no tenderness.  Musculoskeletal: Normal range of motion.  Lymphadenopathy:    She has no cervical adenopathy.  Neurological: She is alert and oriented to person, place, and time. She has normal  reflexes.  Skin: Skin is warm and dry.  Psychiatric: She has a normal mood and affect. Her behavior is normal. Judgment and thought content normal.           Assessment & Plan:  1. Intrinsic asthma, mild intermittent, uncomplicated  avoid all allergens - Fluticasone-Salmeterol (ADVAIR DISKUS) 100-50 MCG/DOSE AEPB; Inhale 1 puff into the lungs 2 (two) times daily.  Dispense: 60 each; Refill: 5  2. Gastroesophageal reflux disease without esophagitis Avoid spicy and fatty foods - famotidine (PEPCID) 40 MG tablet; Take 1 tablet (40 mg total) by mouth as needed.  Dispense: 30 tablet; Refill: 5  3. Hyperlipidemia with target LDL less than 100 Low fta diet - CMP14+EGFR - NMR, lipoprofile  4. HSV-1 (herpes simplex virus 1) infection   Refuses adult immunizations Labs pending Health maintenance reviewed Diet and exercise encouraged Continue all meds Follow up  In 6 months  [place], FNP

## 2014-08-13 NOTE — Patient Instructions (Signed)
Exercise to Stay Healthy Exercise helps you become and stay healthy. EXERCISE IDEAS AND TIPS Choose exercises that:  You enjoy.  Fit into your day. You do not need to exercise really hard to be healthy. You can do exercises at a slow or medium level and stay healthy. You can:  Stretch before and after working out.  Try yoga, Pilates, or tai chi.  Lift weights.  Walk fast, swim, jog, run, climb stairs, bicycle, dance, or rollerskate.  Take aerobic classes. Exercises that burn about 150 calories:  Running 1  miles in 15 minutes.  Playing volleyball for 45 to 60 minutes.  Washing and waxing a car for 45 to 60 minutes.  Playing touch football for 45 minutes.  Walking 1  miles in 35 minutes.  Pushing a stroller 1  miles in 30 minutes.  Playing basketball for 30 minutes.  Raking leaves for 30 minutes.  Bicycling 5 miles in 30 minutes.  Walking 2 miles in 30 minutes.  Dancing for 30 minutes.  Shoveling snow for 15 minutes.  Swimming laps for 20 minutes.  Walking up stairs for 15 minutes.  Bicycling 4 miles in 15 minutes.  Gardening for 30 to 45 minutes.  Jumping rope for 15 minutes.  Washing windows or floors for 45 to 60 minutes. Document Released: 05/27/2010 Document Revised: 07/17/2011 Document Reviewed: 05/27/2010 Exit[employer] Patient Information 2015 [employer]. This information is not intended to replace advice given to you by your health care provider. Make sure you discuss any questions you have with your health care provider.  

## 2014-08-14 LAB — NMR, LIPOPROFILE
Cholesterol: 211 mg/dL — ABNORMAL HIGH (ref 100–199)
HDL Cholesterol by NMR: 64 mg/dL (ref >39)
HDL Particle Number: 35.5 umol/L (ref >=30.5)
LDL Particle Number: 1381 nmol/L — ABNORMAL HIGH (ref <1000)
LDL Size: 21.5 nm (ref >20.5)
LDL-C: 116 mg/dL — ABNORMAL HIGH (ref 0–99)
LP-IR Score: 25 (ref <=45)
Small LDL Particle Number: 201 nmol/L (ref <=527)
Triglycerides by NMR: 155 mg/dL — ABNORMAL HIGH (ref 0–149)

## 2014-08-14 LAB — ANEMIA PROFILE B
Basophils Absolute: 0 10*3/uL (ref 0.0–0.2)
Basos: 0 %
EOS ABS: 0.4 10*3/uL (ref 0.0–0.4)
Eos: 10 %
FERRITIN: 79 ng/mL (ref 15–150)
Folate: 15.9 ng/mL (ref >3.0)
HCT: 36.8 % (ref 34.0–46.6)
Hemoglobin: 11.9 g/dL (ref 11.1–15.9)
IRON SATURATION: 34 % (ref 15–55)
Immature Grans (Abs): 0 10*3/uL (ref 0.0–0.1)
Immature Granulocytes: 0 %
Iron: 92 ug/dL (ref 27–139)
Lymphocytes Absolute: 1.5 10*3/uL (ref 0.7–3.1)
Lymphs: 34 %
MCH: 27.2 pg (ref 26.6–33.0)
MCHC: 32.3 g/dL (ref 31.5–35.7)
MCV: 84 fL (ref 79–97)
MONOCYTES: 7 %
MONOS ABS: 0.3 10*3/uL (ref 0.1–0.9)
Neutrophils Absolute: 2.2 10*3/uL (ref 1.4–7.0)
Neutrophils Relative %: 49 %
Platelets: 315 10*3/uL (ref 150–379)
RBC: 4.38 x10E6/uL (ref 3.77–5.28)
RDW: 14 % (ref 12.3–15.4)
Retic Ct Pct: 0.6 % (ref 0.6–2.6)
TIBC: 267 ug/dL (ref 250–450)
UIBC: 175 ug/dL (ref 118–369)
Vitamin B-12: 443 pg/mL (ref 211–946)
WBC: 4.5 10*3/uL (ref 3.4–10.8)

## 2014-08-14 LAB — CMP14+EGFR
ALT: 10 IU/L (ref 0–32)
AST: 17 IU/L (ref 0–40)
Albumin/Globulin Ratio: 1.7 (ref 1.1–2.5)
Albumin: 4.5 g/dL (ref 3.6–4.8)
Alkaline Phosphatase: 68 IU/L (ref 39–117)
BUN/Creatinine Ratio: 13 (ref 11–26)
BUN: 10 mg/dL (ref 8–27)
Bilirubin Total: 0.5 mg/dL (ref 0.0–1.2)
CALCIUM: 9.8 mg/dL (ref 8.7–10.3)
CO2: 27 mmol/L (ref 18–29)
Chloride: 101 mmol/L (ref 97–108)
Creatinine, Ser: 0.77 mg/dL (ref 0.57–1.00)
GFR calc Af Amer: 92 mL/min/{1.73_m2} (ref >59)
GFR calc non Af Amer: 80 mL/min/{1.73_m2} (ref >59)
Globulin, Total: 2.6 g/dL (ref 1.5–4.5)
Glucose: 88 mg/dL (ref 65–99)
POTASSIUM: 4.5 mmol/L (ref 3.5–5.2)
Sodium: 143 mmol/L (ref 134–144)
Total Protein: 7.1 g/dL (ref 6.0–8.5)

## 2014-09-14 ENCOUNTER — Telehealth: Payer: Self-pay | Admitting: Nurse Practitioner

## 2014-09-14 NOTE — Telephone Encounter (Signed)
adviar does not come in generic- can you please find out what ins will pay for other then advair

## 2014-09-15 MED ORDER — BUDESONIDE-FORMOTEROL FUMARATE 80-4.5 MCG/ACT IN AERO: 2.0000 | INHALATION_SPRAY | Freq: Two times a day (BID) | RESPIRATORY_TRACT | Status: DC

## 2014-09-15 NOTE — Telephone Encounter (Signed)
Please see what they will pay for

## 2014-09-15 NOTE — Telephone Encounter (Signed)
Held on line for 10 minutes, we do not have time for this, just try something else

## 2014-09-15 NOTE — Telephone Encounter (Signed)
lmovm that another Rx has been sent in to pharmacy

## 2014-09-15 NOTE — Telephone Encounter (Signed)
Will try symbicort

## 2014-10-30 DIAGNOSIS — H524 Presbyopia: Secondary | ICD-10-CM | POA: Diagnosis not present

## 2014-10-30 DIAGNOSIS — H43812 Vitreous degeneration, left eye: Secondary | ICD-10-CM | POA: Diagnosis not present

## 2014-10-30 DIAGNOSIS — H5213 Myopia, bilateral: Secondary | ICD-10-CM | POA: Diagnosis not present

## 2014-10-30 DIAGNOSIS — H52223 Regular astigmatism, bilateral: Secondary | ICD-10-CM | POA: Diagnosis not present

## 2014-11-24 ENCOUNTER — Encounter: Payer: Medicare Other | Admitting: Women's Health

## 2014-11-24 DIAGNOSIS — Z803 Family history of malignant neoplasm of breast: Secondary | ICD-10-CM | POA: Diagnosis not present

## 2014-11-24 DIAGNOSIS — Z1231 Encounter for screening mammogram for malignant neoplasm of breast: Secondary | ICD-10-CM | POA: Diagnosis not present

## 2014-11-26 ENCOUNTER — Encounter: Payer: Self-pay | Admitting: Women's Health

## 2014-12-01 ENCOUNTER — Encounter: Payer: Self-pay | Admitting: Women's Health

## 2014-12-01 ENCOUNTER — Ambulatory Visit (INDEPENDENT_AMBULATORY_CARE_PROVIDER_SITE_OTHER): Payer: Medicare Other | Admitting: Women's Health

## 2014-12-01 VITALS — BP 146/80 | Ht 63.0 in | Wt 130.0 lb

## 2014-12-01 DIAGNOSIS — Z01419 Encounter for gynecological examination (general) (routine) without abnormal findings: Secondary | ICD-10-CM | POA: Diagnosis not present

## 2014-12-01 DIAGNOSIS — B009 Herpesviral infection, unspecified: Secondary | ICD-10-CM | POA: Diagnosis not present

## 2014-12-01 MED ORDER — VALACYCLOVIR HCL 500 MG PO TABS: ORAL_TABLET | ORAL | Status: DC

## 2014-12-01 NOTE — Progress Notes (Signed)
Latasha Schmidt. Erkkila [birth year] [id number]    History:    Presents for breast and pelvic exam. 1992 TAH for menorrhagia on no HRT. 1985 CIN-1 cryo-with normal Paps after. Normal mammogram history. 2015 T score -1.9 at femoral neck FRAX 4.5%/0.7%.   Past medical history, past surgical history, family history and social history were all reviewed and documented in the EPIC chart. Helping to care for grandchildren. Remarried first husband  ROS:  A ROS was performed and pertinent positives and negatives are included.  Exam:  Filed Vitals:   12/01/14 1512  BP: 146/80    General appearance:  Normal Thyroid:  Symmetrical, normal in size, without palpable masses or nodularity. Respiratory  Auscultation:  Clear without wheezing or rhonchi Cardiovascular  Auscultation:  Regular rate, without rubs, murmurs or gallops  Edema/varicosities:  Not grossly evident Abdominal  Soft,nontender, without masses, guarding or rebound.  Liver/spleen:  No organomegaly noted  Hernia:  None appreciated  Skin  Inspection:  Grossly normal   Breasts: Examined lying and sitting.     Right: Without masses, retractions, discharge or axillary adenopathy.     Left: Without masses, retractions, discharge or axillary adenopathy. Gentitourinary   Inguinal/mons:  Normal without inguinal adenopathy  External genitalia:  Normal  BUS/Urethra/Skene's glands:  Normal  Vagina:  Normal  Cervix:  Uterus absent  Adnexa/parametria:     Rt: Without masses or tenderness.   Lt: Without masses or tenderness.  Anus and perineum: Normal  Digital rectal exam: Normal sphincter tone without palpated masses or tenderness  Assessment/Plan:  69 y.o. MBF G2 P2  for breast and pelvic exam.  TAH for menorrhagia no HRT  Osteopenia without elevated FRAX HSV rare outbreaks Hyperlipidemia/asthma-primary care managing labs and meds  Plan: SBE's, continue annual screening mammogram, calcium rich diet, vitamin D 1000 daily encouraged.  Home safety, fall prevention and importance of weightbearing exercise reviewed. Valtrex 500 twice daily for 3-5 days as needed, prescription, proper use given. Continue healthy lifestyle of diet and exercise. UA. Strongly encouraged Zostavax and Pneumovax. Instructed to discuss with primary care. Reviewed blood pressure slightly elevated will recheck at home follow-up with primary care if greater than 130/80.     [place], 5:13 PM 12/01/2014

## 2014-12-01 NOTE — Patient Instructions (Signed)

## 2014-12-02 LAB — URINALYSIS W MICROSCOPIC + REFLEX CULTURE
BACTERIA UA: NONE SEEN [HPF]
Bilirubin Urine: NEGATIVE
CASTS: NONE SEEN [LPF]
CRYSTALS: NONE SEEN [HPF]
Glucose, UA: NEGATIVE
HGB URINE DIPSTICK: NEGATIVE
Ketones, ur: NEGATIVE
Leukocytes, UA: NEGATIVE
Nitrite: NEGATIVE
Protein, ur: NEGATIVE
RBC / HPF: NONE SEEN RBC/HPF (ref <=2)
SPECIFIC GRAVITY, URINE: 1.017 (ref 1.001–1.035)
WBC, UA: NONE SEEN WBC/HPF (ref <=5)
YEAST: NONE SEEN [HPF]
pH: 5 (ref 5.0–8.0)

## 2015-02-04 ENCOUNTER — Telehealth: Payer: Self-pay | Admitting: Nurse Practitioner

## 2015-02-26 ENCOUNTER — Telehealth: Payer: Self-pay | Admitting: Nurse Practitioner

## 2015-03-12 ENCOUNTER — Ambulatory Visit (INDEPENDENT_AMBULATORY_CARE_PROVIDER_SITE_OTHER): Payer: Medicare Other

## 2015-03-12 ENCOUNTER — Ambulatory Visit (INDEPENDENT_AMBULATORY_CARE_PROVIDER_SITE_OTHER): Payer: Medicare Other | Admitting: Family Medicine

## 2015-03-12 ENCOUNTER — Encounter: Payer: Self-pay | Admitting: Family Medicine

## 2015-03-12 VITALS — BP 149/81 | HR 69 | Temp 97.0°F | Ht 63.0 in | Wt 130.4 lb

## 2015-03-12 DIAGNOSIS — J4520 Mild intermittent asthma, uncomplicated: Secondary | ICD-10-CM | POA: Diagnosis not present

## 2015-03-12 DIAGNOSIS — R05 Cough: Secondary | ICD-10-CM

## 2015-03-12 DIAGNOSIS — R059 Cough, unspecified: Secondary | ICD-10-CM

## 2015-03-12 DIAGNOSIS — J4541 Moderate persistent asthma with (acute) exacerbation: Secondary | ICD-10-CM | POA: Diagnosis not present

## 2015-03-12 MED ORDER — PREDNISONE 20 MG PO TABS
40.0000 mg | ORAL_TABLET | Freq: Every day | ORAL | Status: DC
Start: 2015-03-12 — End: 2015-03-26

## 2015-03-12 MED ORDER — FLUTICASONE FUROATE-VILANTEROL 100-25 MCG/INH IN AEPB: 1.0000 | INHALATION_SPRAY | Freq: Every day | RESPIRATORY_TRACT | Status: DC

## 2015-03-12 MED ORDER — AZITHROMYCIN 250 MG PO TABS: ORAL_TABLET | ORAL | Status: DC

## 2015-03-12 NOTE — Patient Instructions (Signed)
Great to meet you!  Take breo one puff once daily, rinse your mouth out afterward.   I have sent prednisone and azithromycin to your pharmacy.   Acute Bronchitis Bronchitis is inflammation of the airways that extend from the windpipe into the lungs (bronchi). The inflammation often causes mucus to develop. This leads to a cough, which is the most common symptom of bronchitis.  In acute bronchitis, the condition usually develops suddenly and goes away over time, usually in a couple weeks. Smoking, allergies, and asthma can make bronchitis worse. Repeated episodes of bronchitis may cause further lung problems.  CAUSES Acute bronchitis is most often caused by the same virus that causes a cold. The virus can spread from person to person (contagious) through coughing, sneezing, and touching contaminated objects. SIGNS AND SYMPTOMS   Cough.   Fever.   Coughing up mucus.   Body aches.   Chest congestion.   Chills.   Shortness of breath.   Sore throat.  DIAGNOSIS  Acute bronchitis is usually diagnosed through a physical exam. Your health care provider will also ask you questions about your medical history. Tests, such as chest X-rays, are sometimes done to rule out other conditions.  TREATMENT  Acute bronchitis usually goes away in a couple weeks. Oftentimes, no medical treatment is necessary. Medicines are sometimes given for relief of fever or cough. Antibiotic medicines are usually not needed but may be prescribed in certain situations. In some cases, an inhaler may be recommended to help reduce shortness of breath and control the cough. A cool mist vaporizer may also be used to help thin bronchial secretions and make it easier to clear the chest.  HOME CARE INSTRUCTIONS  Get plenty of rest.   Drink enough fluids to keep your urine clear or pale yellow (unless you have a medical condition that requires fluid restriction). Increasing fluids may help thin your respiratory  secretions (sputum) and reduce chest congestion, and it will prevent dehydration.   Take medicines only as directed by your health care provider.  If you were prescribed an antibiotic medicine, finish it all even if you start to feel better.  Avoid smoking and secondhand smoke. Exposure to cigarette smoke or irritating chemicals will make bronchitis worse. If you are a smoker, consider using nicotine gum or skin patches to help control withdrawal symptoms. Quitting smoking will help your lungs heal faster.   Reduce the chances of another bout of acute bronchitis by washing your hands frequently, avoiding people with cold symptoms, and trying not to touch your hands to your mouth, nose, or eyes.   Keep all follow-up visits as directed by your health care provider.  SEEK MEDICAL CARE IF: Your symptoms do not improve after 1 week of treatment.  SEEK IMMEDIATE MEDICAL CARE IF:  You develop an increased fever or chills.   You have chest pain.   You have severe shortness of breath.  You have bloody sputum.   You develop dehydration.  You faint or repeatedly feel like you are going to pass out.  You develop repeated vomiting.  You develop a severe headache. MAKE SURE YOU:   Understand these instructions.  Will watch your condition.  Will get help right away if you are not doing well or get worse.   This information is not intended to replace advice given to you by your health care provider. Make sure you discuss any questions you have with your health care provider.   Document Released: 06/01/2004 Document Revised: 05/15/2014  Document Reviewed: 10/15/2012 Elsevier Interactive Patient Education 2016 [employer] Inc.  

## 2015-03-12 NOTE — Progress Notes (Signed)
   HPI  Patient presents today here with cough and dyspnea.  Patient spends that she's been ill for about one and a half weeks. She describes dyspnea, productive cough, congestion, and chest tightness. She's also had decreased appetite.   PMH: Smoking status noted ROS: Per HPI  Objective: BP 149/81 mmHg  Pulse 69  Temp(Src) 97 F (36.1 C) (Oral)  Ht 5\' 3"  (1.6 m)  Wt 130 lb 6.4 oz (59.149 kg)  BMI 23.11 kg/m2 Gen: NAD, alert, cooperative with exam HEENT: NCAT, EOMI, PERRL CV: RRR, good S1/S2, no murmur Resp: non labored, expiratory wheezes throughout Ext: No edema, warm Neuro: Alert and oriented, No gross deficits  CXR- No infiltrate, hyperexpanded  Assessment and plan:  # Asthma exacerbation, acute bronchitis Prednisone and azithromycin Non labored, albuterol helping Reasons to return reviewed  # Moderate persistent asthma Change advair to breo for formulary preference Given sample and co-pay card   Orders Placed This Encounter  Procedures  . DG Chest 2 View    Standing Status: Future     Number of Occurrences:      Standing Expiration Date: 05/11/2016    Order Specific Question:  Reason for Exam (SYMPTOM  OR DIAGNOSIS REQUIRED)    Answer:  cough    Order Specific Question:  Preferred imaging location?    Answer:  Internal    No orders of the defined types were placed in this encounter.    Laroy Apple, MD [facility] 03/12/2015, 9:47 AM

## 2015-03-15 ENCOUNTER — Other Ambulatory Visit: Payer: Self-pay | Admitting: *Deleted

## 2015-03-15 MED ORDER — FLUTICASONE FUROATE-VILANTEROL 100-25 MCG/INH IN AEPB: 1.0000 | INHALATION_SPRAY | Freq: Every day | RESPIRATORY_TRACT | Status: DC

## 2015-03-26 ENCOUNTER — Encounter: Payer: Self-pay | Admitting: Pediatrics

## 2015-03-26 ENCOUNTER — Ambulatory Visit (INDEPENDENT_AMBULATORY_CARE_PROVIDER_SITE_OTHER): Payer: Medicare Other

## 2015-03-26 ENCOUNTER — Ambulatory Visit (INDEPENDENT_AMBULATORY_CARE_PROVIDER_SITE_OTHER): Payer: Medicare Other | Admitting: Pediatrics

## 2015-03-26 ENCOUNTER — Encounter (INDEPENDENT_AMBULATORY_CARE_PROVIDER_SITE_OTHER): Payer: Self-pay

## 2015-03-26 VITALS — BP 129/74 | HR 59 | Temp 97.6°F | Ht 63.0 in | Wt 130.2 lb

## 2015-03-26 DIAGNOSIS — Z0000 Encounter for general adult medical examination without abnormal findings: Secondary | ICD-10-CM | POA: Diagnosis not present

## 2015-03-26 DIAGNOSIS — R9389 Abnormal findings on diagnostic imaging of other specified body structures: Secondary | ICD-10-CM

## 2015-03-26 DIAGNOSIS — J209 Acute bronchitis, unspecified: Secondary | ICD-10-CM | POA: Diagnosis not present

## 2015-03-26 DIAGNOSIS — Z23 Encounter for immunization: Secondary | ICD-10-CM

## 2015-03-26 NOTE — Progress Notes (Signed)
Subjective:   Latasha Schmidt is a 69 y.o. female who presents for a Medicare Annual Wellness Visit.  Takes a stool softener reguarly Trying to drink more water No chest pain Feels like breathing is 100% better while on the Breo Having a hard time paying for it, w/ insurance coverage it would be $298, advair is $103.   Review of Systems  All systems negative other than whatis in HPI  Current Medications (verified) Outpatient Encounter Prescriptions as of 03/26/2015  Medication Sig  . albuterol (PROAIR HFA) 108 (90 BASE) MCG/ACT inhaler Inhale 2 puffs into the lungs every 6 (six) hours as needed for wheezing.  . famotidine (PEPCID) 40 MG tablet Take 1 tablet (40 mg total) by mouth as needed.  . Fluticasone Furoate-Vilanterol 100-25 MCG/INH AEPB Inhale 1 puff into the lungs daily.  Marland Kitchen CALCIUM PO Take by mouth daily.  . Cholecalciferol (VITAMIN D PO) Take 1 tablet by mouth daily.  Marland Kitchen ibuprofen (ADVIL,MOTRIN) 600 MG tablet Take 1 tablet (600 mg total) by mouth every 8 (eight) hours as needed. (Patient not taking: Reported on 03/26/2015)  . Multiple Vitamins-Minerals (CENTRUM SILVER PO) Take 1 tablet by mouth daily.  . valACYclovir (VALTREX) 500 MG tablet TAKE 1 TABLET BY TWICE DAILY FOR 5 DAYS (Patient not taking: Reported on 03/26/2015)  . VITAMIN E PO Take by mouth daily.  . [DISCONTINUED] azithromycin (ZITHROMAX) 250 MG tablet Take 2 tablets on day 1 and 1 tablet daily after that (Patient not taking: Reported on 03/26/2015)  . [DISCONTINUED] predniSONE (DELTASONE) 20 MG tablet Take 2 tablets (40 mg total) by mouth daily with breakfast. (Patient not taking: Reported on 03/26/2015)   No facility-administered encounter medications on file as of 03/26/2015.    Allergies (verified) Amoxicillin; Aspirin; and Benadryl   History: Past Medical History  Diagnosis Date  . Asthma   . Anemia   . GERD (gastroesophageal reflux disease)   . Heart murmur   . Fibroid   . 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 Osteopenia 11/2013    T score -1.9 FRAX 4.5%/0.7%  . CIN I (cervical intraepithelial neoplasia I)    Past Surgical History  Procedure Laterality Date  . Colonoscopy    . Abdominal hysterectomy  02/1991    TAH  . Endometrial ablation    . Tubal ligation    . Colposcopy    . Gynecologic cryosurgery     Family History  Problem Relation Age of Onset  . Colon cancer Mother   . Esophageal cancer Neg Hx   . Rectal cancer Neg Hx   . Stomach cancer Neg Hx   . Hypertension Father   . Breast cancer Maternal Aunt     Age 60  . Heart disease Maternal Grandfather   . Cancer Maternal Grandmother     Unknown type    Social History   Occupational History  . Not on file.   Social History Main Topics  . Smoking status: Never Smoker   . Smokeless tobacco: Not on file  . Alcohol Use: No  . Drug Use: No  . Sexual Activity: Yes    Birth Control/ Protection: Surgical     Comment: intercourse age 57, sexual partners less than 5    Do you feel safe at home?  Yes, lives with husband, recently had colon ca. 3 daughters live nearby  Dietary issues and exercise activities: Current Exercise Habits:: The patient does not participate in regular exercise at present, is  up and around cleaning the house all day  Current Dietary habits:  Not eating breakfast, eats some fried chicken and bacon, also tries to get fruits  Objective:    Today's Vitals   03/26/15 1050  BP: 129/74  Pulse: 59  Temp: 97.6 F (36.4 C)  TempSrc: Oral  Height: 5\' 3"  (1.6 m)  Weight: 130 lb 3.2 oz (59.058 kg)   Body mass index is 23.07 kg/(m^2).  Activities of Daily Living In your present state of health, do you have any difficulty performing the following activities: 03/26/2015  Hearing? N  Vision? N  Difficulty concentrating or making decisions? N  Walking or climbing stairs? N  Dressing or bathing? N  Doing errands, shopping? N  Preparing Food and eating ? N  Using the  Toilet? N  In the past six months, have you accidently leaked urine? N  Do you have problems with loss of bowel control? N  Managing your Medications? N  Managing your Finances? N  Housekeeping or managing your Housekeeping? N    Are there smokers in your home (other than you)? No   Cardiac Risk Factors include: advanced age (>27men, >61 women);sedentary lifestyle  Depression Screen PHQ 2/9 Scores 03/26/2015 08/13/2014 03/21/2013  PHQ - 2 Score 0 0 0    Fall Risk Fall Risk  03/26/2015 08/13/2014 03/21/2013  Falls in the past year? No No No    Cognitive Function: MMSE - Mini Mental State Exam 03/26/2015  Orientation to time 5  Orientation to Place 5  Registration 3  Attention/ Calculation 5  Recall 3  Language- name 2 objects 2  Language- repeat 1  Language- follow 3 step command 3  Language- read & follow direction 1  Write a sentence 1  Copy design 1  Total score 30    Immunizations and Health Maintenance Immunization History  Administered Date(s) Administered  . Td 04/14/1996   Health Maintenance Due  Topic Date Due  . Hepatitis C Screening  05-29-1945  . ZOSTAVAX  05/09/2007  . PNA vac Low Risk Adult (1 of 2 - PCV13) 05/08/2012  . PAP SMEAR  08/06/2013    Patient Care Team: Chevis Pretty, FNP as PCP - General (Nurse Practitioner)  Indicate any recent Medical Services you may have received from other than [facility] providers in the past year (date may be approximate).    Assessment:    Annual Wellness Visit    Screening Tests Health Maintenance  Topic Date Due  . Hepatitis C Screening  [birth year]  . ZOSTAVAX  05/09/2007  . PNA vac Low Risk Adult (1 of 2 - PCV13) 05/08/2012  . PAP SMEAR  08/06/2013  . INFLUENZA VACCINE  08/06/2015 (Originally 12/07/2014)  . COLONOSCOPY  07/04/2016  . MAMMOGRAM  11/25/2016  . TETANUS/TDAP  08/30/2020  . DEXA SCAN  Completed        Plan:   During the course of the visit Latasha Schmidt was educated and counseled about  the following appropriate screening and preventive services:   Vaccines to include Pneumoccal today, Influenza declined,  Zostavax declined  Colorectal cancer screening--UTD  Cardiovascular disease screening--UTD  Diabetes screening--UTD  Bone Denisty / Osteoporosis Screening--UTD  Mammogram--UTD  PAP--s/p hysterectomy  Glaucoma screening -- appt next month  Nutrition counseling --completed  Advanced Directives-- pt planning on filling out paper, would want husband and daughters to make decisions for her if needed.   Goals    None       Patient Instructions (the written plan) were  given to the patient.   Latasha Maize, MD   03/26/2015

## 2015-04-26 ENCOUNTER — Ambulatory Visit (HOSPITAL_COMMUNITY)
Admission: RE | Admit: 2015-04-26 | Discharge: 2015-04-26 | Disposition: A | Discharge status: Home / Self Care | Payer: Medicare Other | Source: Ambulatory Visit | Attending: Pediatrics | Admitting: Pediatrics

## 2015-04-26 DIAGNOSIS — R911 Solitary pulmonary nodule: Secondary | ICD-10-CM | POA: Diagnosis not present

## 2015-04-26 DIAGNOSIS — R938 Abnormal findings on diagnostic imaging of other specified body structures: Secondary | ICD-10-CM | POA: Diagnosis not present

## 2015-04-26 DIAGNOSIS — R9389 Abnormal findings on diagnostic imaging of other specified body structures: Secondary | ICD-10-CM

## 2015-05-04 DIAGNOSIS — H43812 Vitreous degeneration, left eye: Secondary | ICD-10-CM | POA: Diagnosis not present

## 2015-08-02 DIAGNOSIS — M791 Myalgia: Secondary | ICD-10-CM | POA: Diagnosis not present

## 2015-08-02 DIAGNOSIS — J301 Allergic rhinitis due to pollen: Secondary | ICD-10-CM | POA: Diagnosis not present

## 2015-08-02 DIAGNOSIS — J4520 Mild intermittent asthma, uncomplicated: Secondary | ICD-10-CM | POA: Diagnosis not present

## 2015-08-02 DIAGNOSIS — R5383 Other fatigue: Secondary | ICD-10-CM | POA: Diagnosis not present

## 2015-09-29 ENCOUNTER — Other Ambulatory Visit: Payer: Self-pay | Admitting: Nurse Practitioner

## 2015-10-14 DIAGNOSIS — J4520 Mild intermittent asthma, uncomplicated: Secondary | ICD-10-CM | POA: Diagnosis not present

## 2015-10-14 DIAGNOSIS — R5383 Other fatigue: Secondary | ICD-10-CM | POA: Diagnosis not present

## 2015-10-14 DIAGNOSIS — Z0000 Encounter for general adult medical examination without abnormal findings: Secondary | ICD-10-CM | POA: Diagnosis not present

## 2015-10-14 DIAGNOSIS — M791 Myalgia: Secondary | ICD-10-CM | POA: Diagnosis not present

## 2015-10-14 DIAGNOSIS — J301 Allergic rhinitis due to pollen: Secondary | ICD-10-CM | POA: Diagnosis not present

## 2015-10-14 DIAGNOSIS — E782 Mixed hyperlipidemia: Secondary | ICD-10-CM | POA: Diagnosis not present

## 2015-10-21 DIAGNOSIS — J4520 Mild intermittent asthma, uncomplicated: Secondary | ICD-10-CM | POA: Diagnosis not present

## 2015-10-21 DIAGNOSIS — J301 Allergic rhinitis due to pollen: Secondary | ICD-10-CM | POA: Diagnosis not present

## 2015-10-21 DIAGNOSIS — E782 Mixed hyperlipidemia: Secondary | ICD-10-CM | POA: Diagnosis not present

## 2015-10-21 DIAGNOSIS — R5383 Other fatigue: Secondary | ICD-10-CM | POA: Diagnosis not present

## 2015-10-25 ENCOUNTER — Telehealth: Payer: Self-pay | Admitting: Pediatrics

## 2015-12-21 ENCOUNTER — Ambulatory Visit (INDEPENDENT_AMBULATORY_CARE_PROVIDER_SITE_OTHER): Payer: Medicare Other | Admitting: Women's Health

## 2015-12-21 ENCOUNTER — Encounter: Payer: Self-pay | Admitting: Women's Health

## 2015-12-21 VITALS — BP 122/79 | Ht 63.0 in | Wt 131.8 lb

## 2015-12-21 DIAGNOSIS — B009 Herpesviral infection, unspecified: Secondary | ICD-10-CM | POA: Diagnosis not present

## 2015-12-21 DIAGNOSIS — M8580 Other specified disorders of bone density and structure, unspecified site: Secondary | ICD-10-CM

## 2015-12-21 DIAGNOSIS — Z01419 Encounter for gynecological examination (general) (routine) without abnormal findings: Secondary | ICD-10-CM

## 2015-12-21 DIAGNOSIS — M899 Disorder of bone, unspecified: Secondary | ICD-10-CM | POA: Diagnosis not present

## 2015-12-21 DIAGNOSIS — Z13820 Encounter for screening for osteoporosis: Secondary | ICD-10-CM

## 2015-12-21 DIAGNOSIS — Z1231 Encounter for screening mammogram for malignant neoplasm of breast: Secondary | ICD-10-CM | POA: Diagnosis not present

## 2015-12-21 DIAGNOSIS — Z853 Personal history of malignant neoplasm of breast: Secondary | ICD-10-CM | POA: Diagnosis not present

## 2015-12-21 MED ORDER — VALACYCLOVIR HCL 500 MG PO TABS: ORAL_TABLET | ORAL | 12 refills | Status: DC

## 2015-12-21 NOTE — Patient Instructions (Signed)

## 2015-12-21 NOTE — Progress Notes (Signed)
Latasha Schmidt. Hammell May 19, 1945 [id number]    History:    Presents for breast and pelvic exam. 1992 TAH for menorrhagia on no HRT. 1985 CIN-1 with cryo-with normal Paps after. Normal Pap history. 2015 T score -1.9 femoral neck FRAX 4.5%/0.7%. Hypercholesterolemia managed by primary care. 2013 colonoscopy history of adenomatous polyps 5 year follow-up. Pneumovax 2016. Has not had Zostavax. HSV rare outbreaks.  Past medical history, past surgical history, family history and social history were all reviewed and documented in the EPIC chart. Remarried first husband.3 children helps care for her grandchildren several days a week.   ROS:  A ROS was performed and pertinent positives and negatives are included.  Exam:  Vitals:   12/21/15 1132  BP: 122/79  Weight: 131 lb 12.8 oz (59.8 kg)  Height: 5\' 3"  (1.6 m)   Body mass index is 23.35 kg/m.   General appearance:  Normal Thyroid:  Symmetrical, normal in size, without palpable masses or nodularity. Respiratory  Auscultation:  Clear without wheezing or rhonchi Cardiovascular  Auscultation:  Regular rate, without rubs, murmurs or gallops  Edema/varicosities:  Not grossly evident Abdominal  Soft,nontender, without masses, guarding or rebound.  Liver/spleen:  No organomegaly noted  Hernia:  None appreciated  Skin  Inspection:  Grossly normal   Breasts: Examined lying and sitting.     Right: Without masses, retractions, discharge or axillary adenopathy.     Left: Without masses, retractions, discharge or axillary adenopathy. Gentitourinary   Inguinal/mons:  Normal without inguinal adenopathy  External genitalia:  Normal  BUS/Urethra/Skene's glands:  Normal  Vagina:  Normal  Cervix:  uterus absent  Adnexa/parametria:     Rt: Without masses or tenderness.   Lt: Without masses or tenderness.  Anus and perineum: Normal  Digital rectal exam: Normal sphincter tone without palpated masses or tenderness  Assessment/Plan:  70 y.o. MBF G3  P3 for breast and pelvic exam with no complaints.  Postmenopausal on no HRT Osteopenia without elevated FRAX Hypercholesterolemia primary care manages labs and meds HSV-rare outbreaks  Plan: Repeat DEXA. Reviewed importance of home safety, fall prevention and importance of weightbearing exercise. Yoga  encouraged. SBE's, continue annual screening mammogram, calcium rich diet, vitamin D 1000 daily encouraged.  Valtrex 500 twice daily for 3-5 days when necessary prescription, proper use given and reviewed.  Huel Cote [facility], 1:59 PM 12/21/2015

## 2016-01-19 ENCOUNTER — Other Ambulatory Visit: Payer: Self-pay | Admitting: Gynecology

## 2016-01-19 DIAGNOSIS — M8580 Other specified disorders of bone density and structure, unspecified site: Secondary | ICD-10-CM

## 2016-01-19 DIAGNOSIS — Z13820 Encounter for screening for osteoporosis: Secondary | ICD-10-CM

## 2016-01-25 ENCOUNTER — Other Ambulatory Visit: Payer: Self-pay | Admitting: Gynecology

## 2016-01-25 ENCOUNTER — Ambulatory Visit (INDEPENDENT_AMBULATORY_CARE_PROVIDER_SITE_OTHER): Payer: Medicare Other

## 2016-01-25 DIAGNOSIS — M899 Disorder of bone, unspecified: Secondary | ICD-10-CM

## 2016-01-25 DIAGNOSIS — Z13820 Encounter for screening for osteoporosis: Secondary | ICD-10-CM

## 2016-01-25 DIAGNOSIS — M8580 Other specified disorders of bone density and structure, unspecified site: Secondary | ICD-10-CM

## 2016-01-26 ENCOUNTER — Encounter: Payer: Self-pay | Admitting: Gynecology

## 2016-05-18 ENCOUNTER — Encounter: Payer: Self-pay | Admitting: Internal Medicine

## 2016-06-28 IMAGING — CR DG CHEST 2V
2 series · 2 of 2 positions shown · non-contrast
Comparison: No prior.

CLINICAL DATA: Cough and congestion .

EXAM:
CHEST  2 VIEW

[view not recorded (1 of 2)]
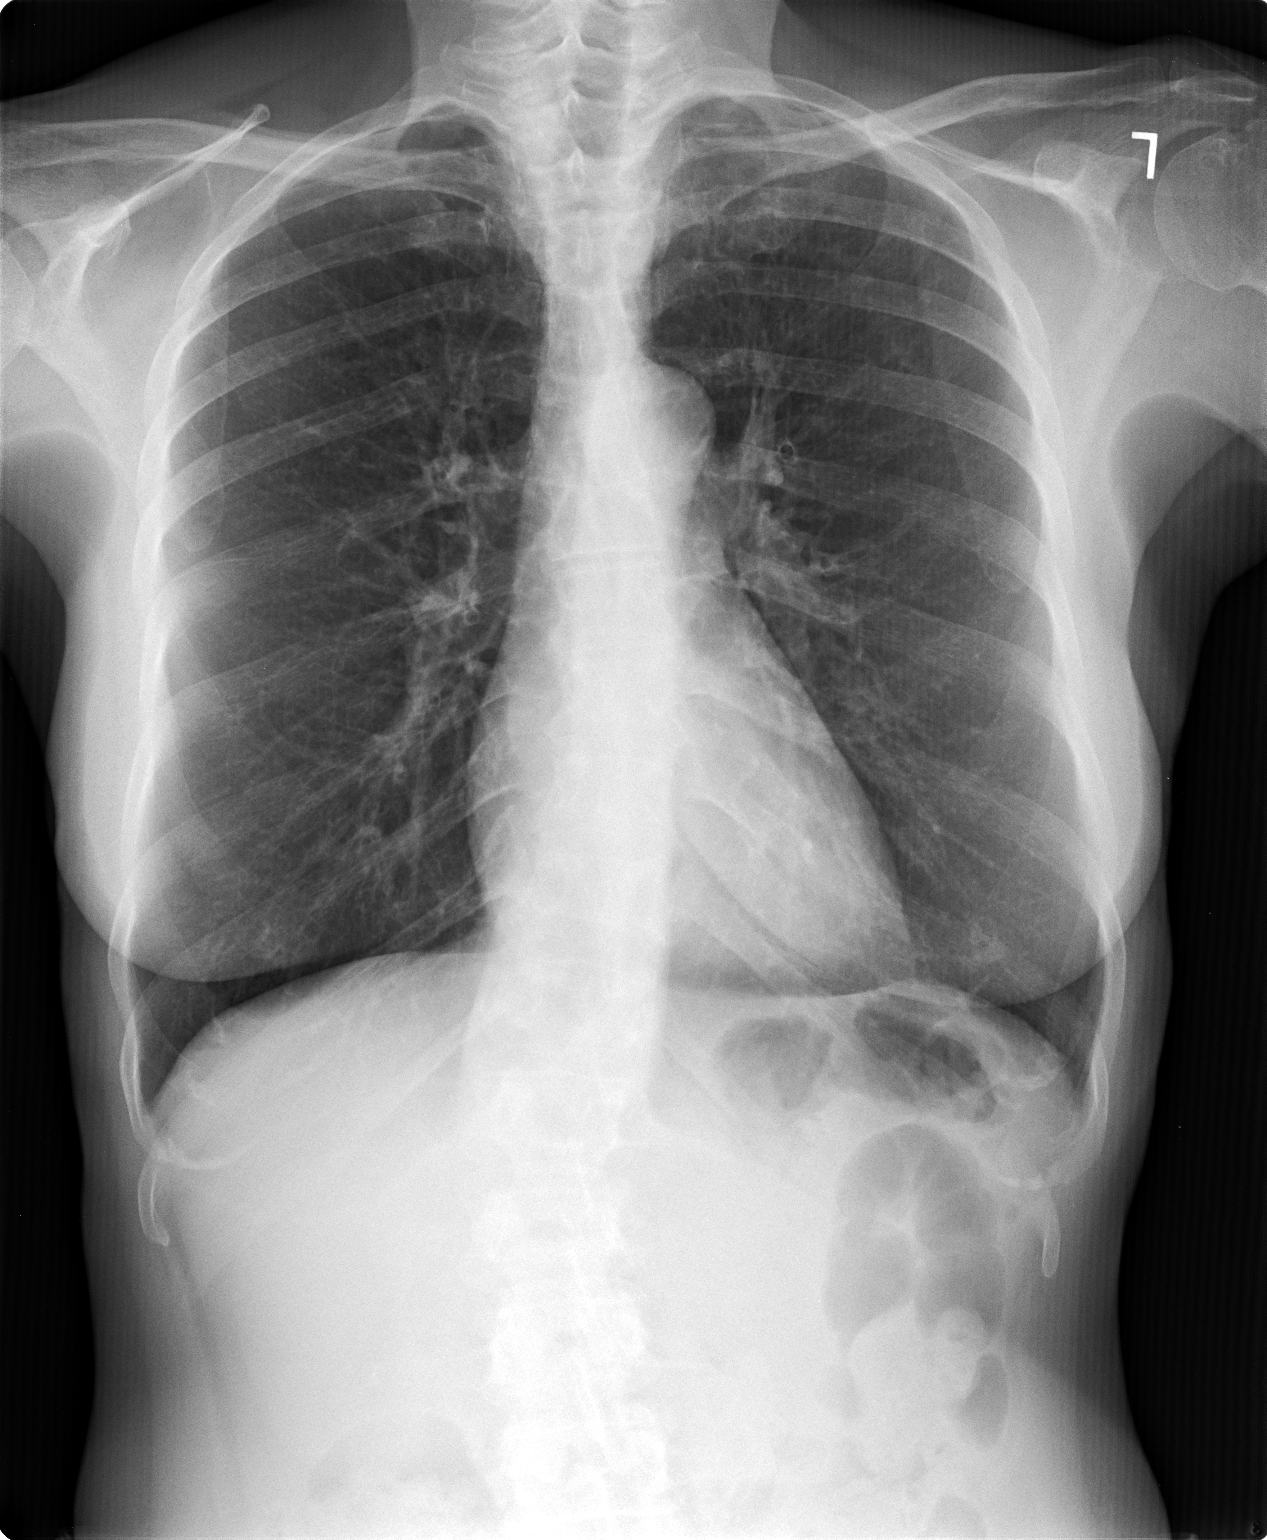

[view not recorded (2 of 2)]
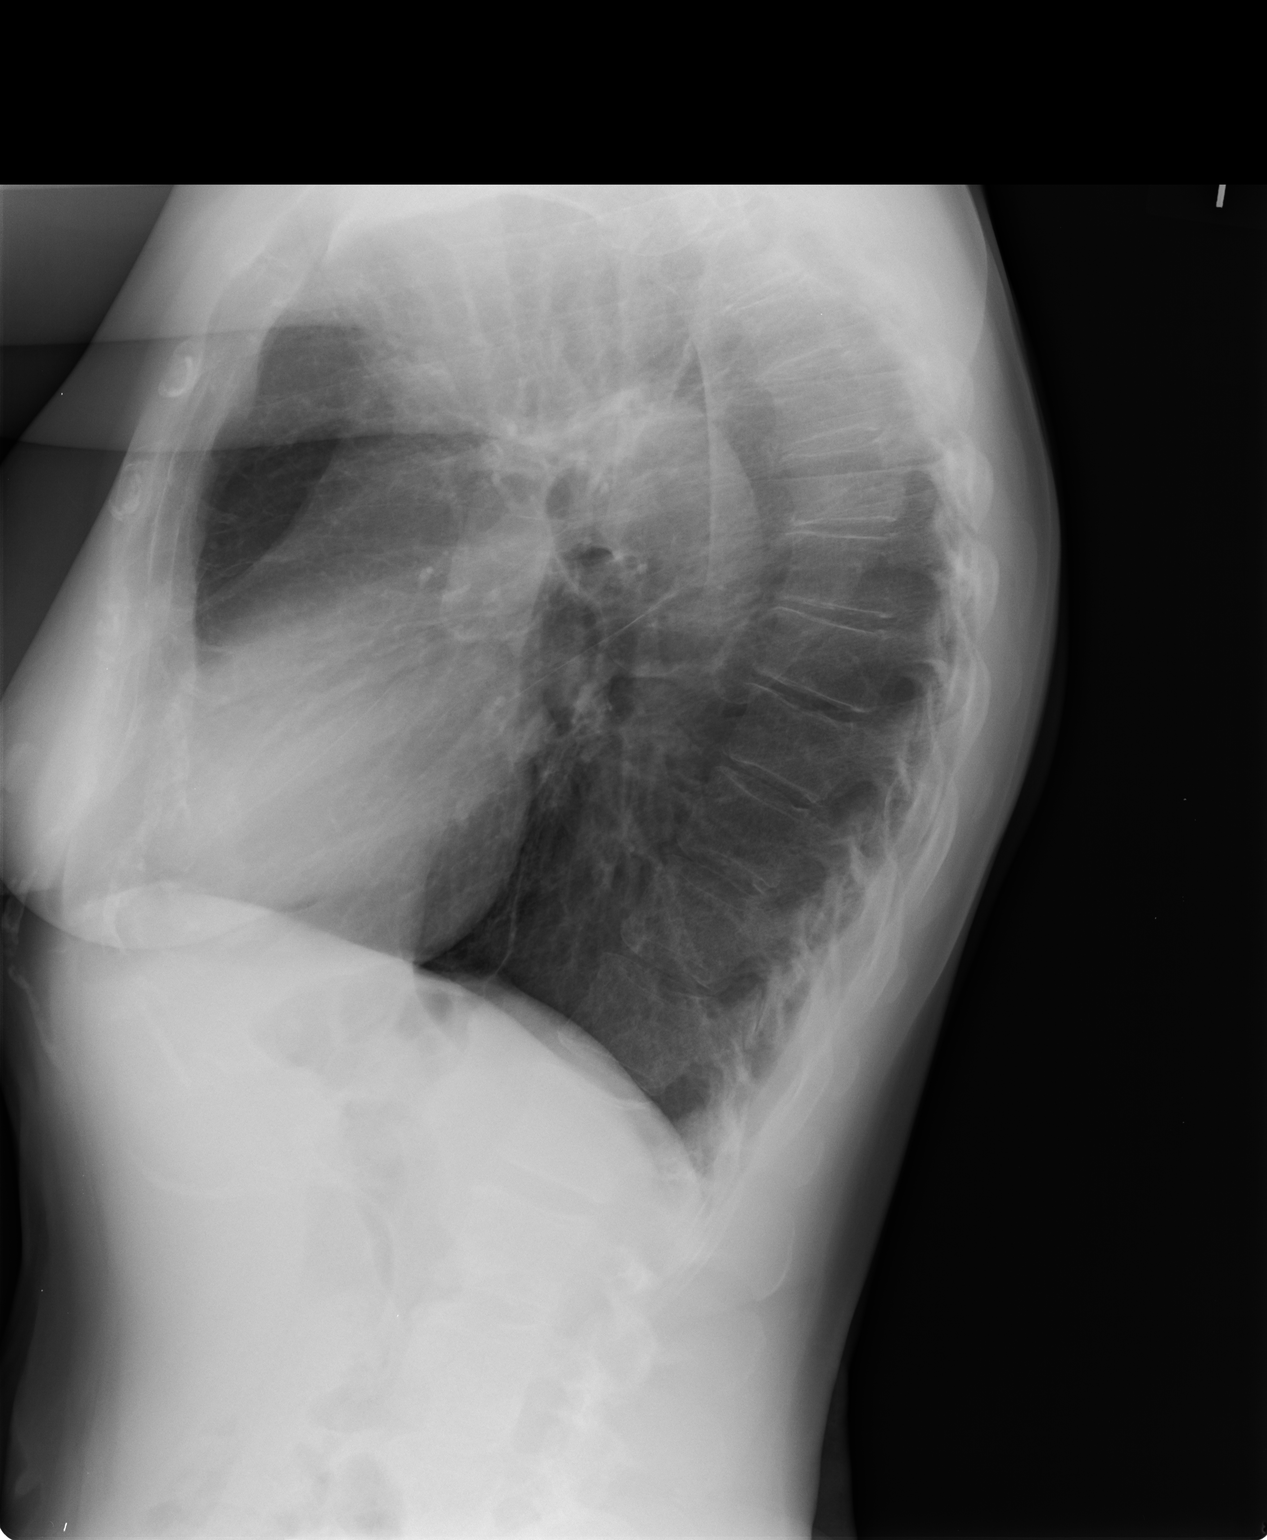

[2 of 2 positions shown; findings below may reference images not displayed]

FINDINGS: Mediastinum hilar structures normal. Small nodular density projected
over the left lung base, possibly nipple shadow. Repeat PA chest
x-ray with nipple markers suggested. Lungs are clear of infiltrates.
No pleural effusion or pneumothorax. No acute bony abnormality.
IMPRESSION: 1. Small nodular opacity projected over the left lung base, most
likely nipple shadow. Repeat PA chest x-ray with nipple markers
suggested.
2. No acute pulmonary infiltrate.

## 2016-07-12 IMAGING — CR DG CHEST SPECIAL VIEW
1 series · 1 of 1 positions shown · non-contrast
Comparison: PA and lateral chest x-ray March 12, 2015

CLINICAL DATA: Re-evaluate left lower lobe nodule with nipple
marker images

EXAM:
CHEST SPECIAL VIEW

[view not recorded]
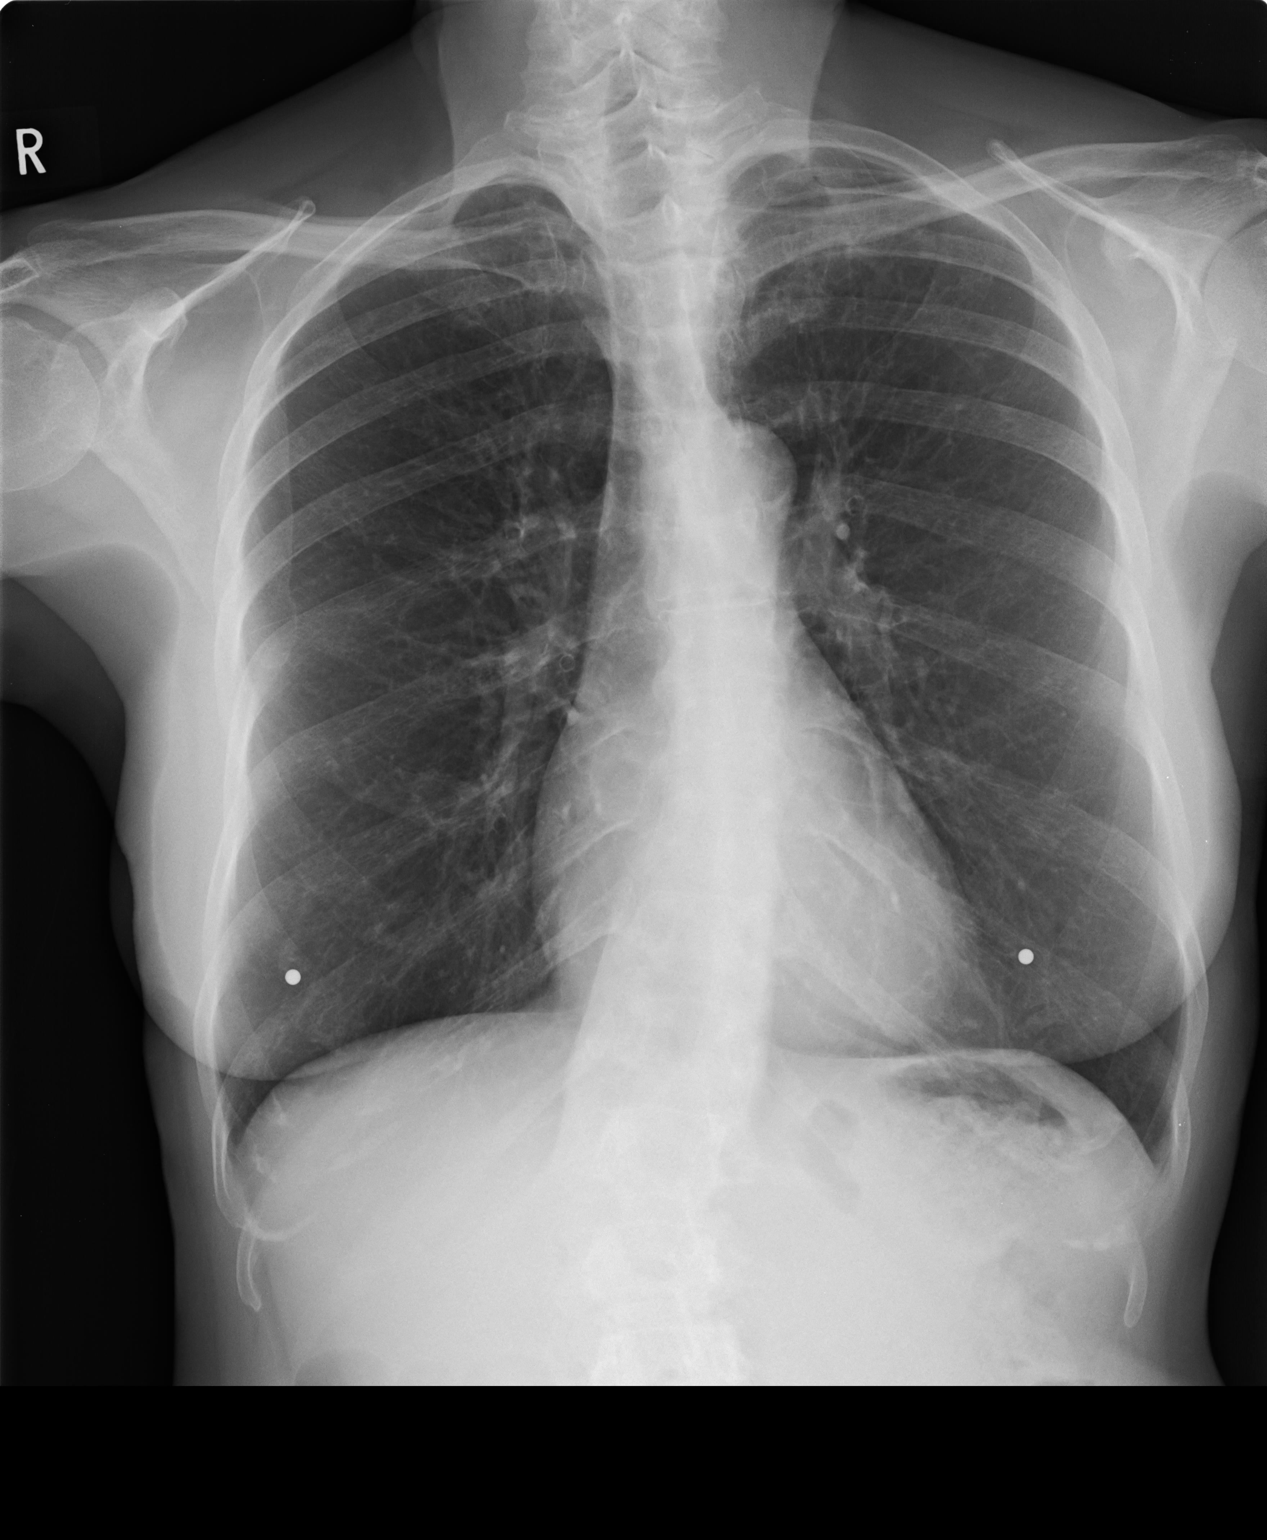

[1 of 1 positions shown; findings below may reference images not displayed]

FINDINGS: The lungs are well-expanded. The nipple markers overlie a subtle
nodule on the right but do not clearly correspond to a nodule on the
left. Faint density just inferior to the nipple marker is observed
may be associated with the areola, deeper breast tissues, or skin
anteriorly or posteriorly. No parenchymal nodule was observed on the
previous lateral film in this region. The heart and pulmonary
vascularity are normal. The mediastinum is normal in width.
IMPRESSION: There is no definite evidence of a abnormal pulmonary nodule. Subtle
nodularity lateral to the left cardiac apex previously demonstrated,
and demonstrated inferior to the nipple marker today, likely lies
within the left breast. A noncontrast chest CT scan is recommended
to confidently exclude parenchymal lung nodules.

## 2016-08-08 DIAGNOSIS — L237 Allergic contact dermatitis due to plants, except food: Secondary | ICD-10-CM | POA: Diagnosis not present

## 2016-10-14 ENCOUNTER — Telehealth: Payer: Self-pay | Admitting: Pediatrics

## 2016-10-14 NOTE — Telephone Encounter (Signed)
Attempted to reach pt re. CT scan follow up from nodules seen 04/2015. Should be low risk given no h/o smoking, not needing f/u CT.

## 2016-10-16 ENCOUNTER — Telehealth: Payer: Self-pay | Admitting: Pediatrics

## 2016-10-16 NOTE — Telephone Encounter (Signed)
Discussed with pt, low risk, no need for f/u CT scan.

## 2016-11-13 DIAGNOSIS — H524 Presbyopia: Secondary | ICD-10-CM | POA: Diagnosis not present

## 2016-11-13 DIAGNOSIS — H5213 Myopia, bilateral: Secondary | ICD-10-CM | POA: Diagnosis not present

## 2016-11-13 DIAGNOSIS — H52223 Regular astigmatism, bilateral: Secondary | ICD-10-CM | POA: Diagnosis not present

## 2016-11-13 DIAGNOSIS — H40053 Ocular hypertension, bilateral: Secondary | ICD-10-CM | POA: Diagnosis not present

## 2016-11-15 DIAGNOSIS — R002 Palpitations: Secondary | ICD-10-CM | POA: Diagnosis not present

## 2016-11-15 DIAGNOSIS — E782 Mixed hyperlipidemia: Secondary | ICD-10-CM | POA: Diagnosis not present

## 2016-11-15 DIAGNOSIS — J4520 Mild intermittent asthma, uncomplicated: Secondary | ICD-10-CM | POA: Diagnosis not present

## 2016-11-15 DIAGNOSIS — Z0000 Encounter for general adult medical examination without abnormal findings: Secondary | ICD-10-CM | POA: Diagnosis not present

## 2016-11-15 DIAGNOSIS — R5383 Other fatigue: Secondary | ICD-10-CM | POA: Diagnosis not present

## 2016-11-27 DIAGNOSIS — J4520 Mild intermittent asthma, uncomplicated: Secondary | ICD-10-CM | POA: Diagnosis not present

## 2016-11-27 DIAGNOSIS — R002 Palpitations: Secondary | ICD-10-CM | POA: Diagnosis not present

## 2016-11-27 DIAGNOSIS — E782 Mixed hyperlipidemia: Secondary | ICD-10-CM | POA: Diagnosis not present

## 2016-11-27 DIAGNOSIS — R5383 Other fatigue: Secondary | ICD-10-CM | POA: Diagnosis not present

## 2017-01-01 DIAGNOSIS — Z803 Family history of malignant neoplasm of breast: Secondary | ICD-10-CM | POA: Diagnosis not present

## 2017-01-01 DIAGNOSIS — Z1231 Encounter for screening mammogram for malignant neoplasm of breast: Secondary | ICD-10-CM | POA: Diagnosis not present

## 2017-01-03 ENCOUNTER — Encounter: Payer: Self-pay | Admitting: Women's Health

## 2017-01-03 ENCOUNTER — Ambulatory Visit (INDEPENDENT_AMBULATORY_CARE_PROVIDER_SITE_OTHER): Payer: Medicare Other | Admitting: Women's Health

## 2017-01-03 VITALS — BP 136/80 | Ht 63.0 in | Wt 134.0 lb

## 2017-01-03 DIAGNOSIS — Z01419 Encounter for gynecological examination (general) (routine) without abnormal findings: Secondary | ICD-10-CM

## 2017-01-03 DIAGNOSIS — M8580 Other specified disorders of bone density and structure, unspecified site: Secondary | ICD-10-CM

## 2017-01-03 NOTE — Progress Notes (Signed)
Latasha Schmidt. [place]uck Sep 12, 1945 [id number]    History:    Presents for breast and pelvic exam with no complaints. 1992 TAH for menorrhagia. 1985 CIN-1 with normal Paps after. Normal mammogram history. 2017 T score -2.1  left femoral neck FRAX 5.1%/1%. Hypercholesterolemia managed by primary care. 2013 a polyp 5 year follow-up has not scheduled. Vaccines current.   Past medical history, past surgical history, family history and social history were all reviewed and documented in the EPIC chart. Married first husband. Helps with grandchildren.  ROS:  A ROS was performed and pertinent positives and negatives are included.  Exam:  Vitals:   01/03/17 0847  BP: 136/80  Weight: 134 lb (60.8 kg)  Height: 5\' 3"  (1.6 m)   Body mass index is 23.74 kg/m.   General appearance:  Normal Thyroid:  Symmetrical, normal in size, without palpable masses or nodularity. Respiratory  Auscultation:  Clear without wheezing or rhonchi Cardiovascular  Auscultation:  Regular rate, without rubs, murmurs or gallops  Edema/varicosities:  Not grossly evident Abdominal  Soft,nontender, without masses, guarding or rebound.  Liver/spleen:  No organomegaly noted  Hernia:  None appreciated  Skin  Inspection:  Grossly normal   Breasts: Examined lying and sitting.     Right: Without masses, retractions, discharge or axillary adenopathy.     Left: Without masses, retractions, discharge or axillary adenopathy. Gentitourinary   Inguinal/mons:  Normal without inguinal adenopathy  External genitalia:  Normal  BUS/Urethra/Skene's glands:  Normal  Vagina:  Normal  Cervix: And uterus absent  Adnexa/parametria:     Rt: Without masses or tenderness.   Lt: Without masses or tenderness.  Anus and perineum: Normal  Digital rectal exam: Normal sphincter tone without palpated masses or tenderness  Assessment/Plan:  71 y.o. MBF G2 P2 for breast and pelvic exam with no complaints.  1992 TAH for menorrhagia HSV-1 rare  outbreaks 1985 CIN-1 with normal Paps after Osteopenia without elevated FRAX Hypercholesterolemia-primary care manages labs and meds  Plan: [facility] information given instructed to schedule 5 year follow-up colonoscopy.  SBE's, continue annual screening mammogram, calcium rich diet, vitamin D 2000 daily encouraged. Reviewed importance of home safety, fall prevention and weight bearing exercise. Reviewed new shingles vaccine Shingrex will follow-up with primary care.    Huel Cote [facility], 9:50 AM 01/03/2017

## 2017-01-03 NOTE — Patient Instructions (Addendum)
[facility]  [phone] shingrex  Shingles vaccine  [facility]  Start with easy/chair   Health Maintenance for Postmenopausal Women Menopause is a normal process in which your reproductive ability comes to an end. This process happens gradually over a span of months to years, usually between the ages of 57 and 30. Menopause is complete when you have missed 12 consecutive menstrual periods. It is important to talk with your health care provider about some of the most common conditions that affect postmenopausal women, such as heart disease, cancer, and bone loss (osteoporosis). Adopting a healthy lifestyle and getting preventive care can help to promote your health and wellness. Those actions can also lower your chances of developing some of these common conditions. What should I know about menopause? During menopause, you may experience a number of symptoms, such as:  Moderate-to-severe hot flashes.  Night sweats.  Decrease in sex drive.  Mood swings.  Headaches.  Tiredness.  Irritability.  Memory problems.  Insomnia.  Choosing to treat or not to treat menopausal changes is an individual decision that you make with your health care provider. What should I know about hormone replacement therapy and supplements? Hormone therapy products are effective for treating symptoms that are associated with menopause, such as hot flashes and night sweats. Hormone replacement carries certain risks, especially as you become older. If you are thinking about using estrogen or estrogen with progestin treatments, discuss the benefits and risks with your health care provider. What should I know about heart disease and stroke? Heart disease, heart attack, and stroke become more likely as you age. This may be due, in part, to the hormonal changes that your body experiences during menopause. These can affect how your body processes dietary fats, triglycerides, and cholesterol. Heart attack and  stroke are both medical emergencies. There are many things that you can do to help prevent heart disease and stroke:  Have your blood pressure checked at least every 1-2 years. High blood pressure causes heart disease and increases the risk of stroke.  If you are 32-31 years old, ask your health care provider if you should take aspirin to prevent a heart attack or a stroke.  Do not use any tobacco products, including cigarettes, chewing tobacco, or electronic cigarettes. If you need help quitting, ask your health care provider.  It is important to eat a healthy diet and maintain a healthy weight. ? Be sure to include plenty of vegetables, fruits, low-fat dairy products, and lean protein. ? Avoid eating foods that are high in solid fats, added sugars, or salt (sodium).  Get regular exercise. This is one of the most important things that you can do for your health. ? Try to exercise for at least 150 minutes each week. The type of exercise that you do should increase your heart rate and make you sweat. This is known as moderate-intensity exercise. ? Try to do strengthening exercises at least twice each week. Do these in addition to the moderate-intensity exercise.  Know your numbers.Ask your health care provider to check your cholesterol and your blood glucose. Continue to have your blood tested as directed by your health care provider.  What should I know about cancer screening? There are several types of cancer. Take the following steps to reduce your risk and to catch any cancer development as early as possible. Breast Cancer  Practice breast self-awareness. ? This means understanding how your breasts normally appear and feel. ? It also  means doing regular breast self-exams. Let your health care provider know about any changes, no matter how small.  If you are 5 or older, have a clinician do a breast exam (clinical breast exam or CBE) every year. Depending on your age, family history,  and medical history, it may be recommended that you also have a yearly breast X-ray (mammogram).  If you have a family history of breast cancer, talk with your health care provider about genetic screening.  If you are at high risk for breast cancer, talk with your health care provider about having an MRI and a mammogram every year.  Breast cancer (BRCA) gene test is recommended for women who have family members with BRCA-related cancers. Results of the assessment will determine the need for genetic counseling and BRCA1 and for BRCA2 testing. BRCA-related cancers include these types: ? Breast. This occurs in males or females. ? Ovarian. ? Tubal. This may also be called fallopian tube cancer. ? Cancer of the abdominal or pelvic lining (peritoneal cancer). ? Prostate. ? Pancreatic.  Cervical, Uterine, and Ovarian Cancer Your health care provider may recommend that you be screened regularly for cancer of the pelvic organs. These include your ovaries, uterus, and vagina. This screening involves a pelvic exam, which includes checking for microscopic changes to the surface of your cervix (Pap test).  For women ages 21-65, health care providers may recommend a pelvic exam and a Pap test every three years. For women ages 70-65, they may recommend the Pap test and pelvic exam, combined with testing for human papilloma virus (HPV), every five years. Some types of HPV increase your risk of cervical cancer. Testing for HPV may also be done on women of any age who have unclear Pap test results.  Other health care providers may not recommend any screening for nonpregnant women who are considered low risk for pelvic cancer and have no symptoms. Ask your health care provider if a screening pelvic exam is right for you.  If you have had past treatment for cervical cancer or a condition that could lead to cancer, you need Pap tests and screening for cancer for at least 20 years after your treatment. If Pap tests  have been discontinued for you, your risk factors (such as having a new sexual partner) need to be reassessed to determine if you should start having screenings again. Some women have medical problems that increase the chance of getting cervical cancer. In these cases, your health care provider may recommend that you have screening and Pap tests more often.  If you have a family history of uterine cancer or ovarian cancer, talk with your health care provider about genetic screening.  If you have vaginal bleeding after reaching menopause, tell your health care provider.  There are currently no reliable tests available to screen for ovarian cancer.  Lung Cancer Lung cancer screening is recommended for adults 77-69 years old who are at high risk for lung cancer because of a history of smoking. A yearly low-dose CT scan of the lungs is recommended if you:  Currently smoke.  Have a history of at least 30 pack-years of smoking and you currently smoke or have quit within the past 15 years. A pack-year is smoking an average of one pack of cigarettes per day for one year.  Yearly screening should:  Continue until it has been 15 years since you quit.  Stop if you develop a health problem that would prevent you from having lung cancer treatment.  Colorectal Cancer  This type of cancer can be detected and can often be prevented.  Routine colorectal cancer screening usually begins at age 51 and continues through age 43.  If you have risk factors for colon cancer, your health care provider may recommend that you be screened at an earlier age.  If you have a family history of colorectal cancer, talk with your health care provider about genetic screening.  Your health care provider may also recommend using home test kits to check for hidden blood in your stool.  A small camera at the end of a tube can be used to examine your colon directly (sigmoidoscopy or colonoscopy). This is done to check for  the earliest forms of colorectal cancer.  Direct examination of the colon should be repeated every 5-10 years until age 87. However, if early forms of precancerous polyps or small growths are found or if you have a family history or genetic risk for colorectal cancer, you may need to be screened more often.  Skin Cancer  Check your skin from head to toe regularly.  Monitor any moles. Be sure to tell your health care provider: ? About any new moles or changes in moles, especially if there is a change in a mole's shape or color. ? If you have a mole that is larger than the size of a pencil eraser.  If any of your family members has a history of skin cancer, especially at a Brissia Delisa age, talk with your health care provider about genetic screening.  Always use sunscreen. Apply sunscreen liberally and repeatedly throughout the day.  Whenever you are outside, protect yourself by wearing long sleeves, pants, a wide-brimmed hat, and sunglasses.  What should I know about osteoporosis? Osteoporosis is a condition in which bone destruction happens more quickly than new bone creation. After menopause, you may be at an increased risk for osteoporosis. To help prevent osteoporosis or the bone fractures that can happen because of osteoporosis, the following is recommended:  If you are 68-42 years old, get at least 1,000 mg of calcium and at least 600 mg of vitamin D per day.  If you are older than age 71 but younger than age 6, get at least 1,200 mg of calcium and at least 600 mg of vitamin D per day.  If you are older than age 50, get at least 1,200 mg of calcium and at least 800 mg of vitamin D per day.  Smoking and excessive alcohol intake increase the risk of osteoporosis. Eat foods that are rich in calcium and vitamin D, and do weight-bearing exercises several times each week as directed by your health care provider. What should I know about how menopause affects my mental health? Depression may  occur at any age, but it is more common as you become older. Common symptoms of depression include:  Low or sad mood.  Changes in sleep patterns.  Changes in appetite or eating patterns.  Feeling an overall lack of motivation or enjoyment of activities that you previously enjoyed.  Frequent crying spells.  Talk with your health care provider if you think that you are experiencing depression. What should I know about immunizations? It is important that you get and maintain your immunizations. These include:  Tetanus, diphtheria, and pertussis (Tdap) booster vaccine.  Influenza every year before the flu season begins.  Pneumonia vaccine.  Shingles vaccine.  Your health care provider may also recommend other immunizations. This information is not intended to replace advice given  to you by your health care provider. Make sure you discuss any questions you have with your health care provider. Document Released: 06/16/2005 Document Revised: 11/12/2015 Document Reviewed: 01/26/2015 Elsevier Interactive Patient Education  2018 [employer].

## 2017-09-04 DIAGNOSIS — E782 Mixed hyperlipidemia: Secondary | ICD-10-CM | POA: Diagnosis not present

## 2017-09-04 DIAGNOSIS — J019 Acute sinusitis, unspecified: Secondary | ICD-10-CM | POA: Diagnosis not present

## 2017-09-04 DIAGNOSIS — J45901 Unspecified asthma with (acute) exacerbation: Secondary | ICD-10-CM | POA: Diagnosis not present

## 2017-09-04 DIAGNOSIS — J4520 Mild intermittent asthma, uncomplicated: Secondary | ICD-10-CM | POA: Diagnosis not present

## 2017-12-03 DIAGNOSIS — E782 Mixed hyperlipidemia: Secondary | ICD-10-CM | POA: Diagnosis not present

## 2017-12-03 DIAGNOSIS — Z0000 Encounter for general adult medical examination without abnormal findings: Secondary | ICD-10-CM | POA: Diagnosis not present

## 2017-12-03 DIAGNOSIS — N390 Urinary tract infection, site not specified: Secondary | ICD-10-CM | POA: Diagnosis not present

## 2017-12-03 DIAGNOSIS — R5383 Other fatigue: Secondary | ICD-10-CM | POA: Diagnosis not present

## 2017-12-10 ENCOUNTER — Encounter: Payer: Self-pay | Admitting: Internal Medicine

## 2017-12-10 DIAGNOSIS — Z800 Family history of malignant neoplasm of digestive organs: Secondary | ICD-10-CM | POA: Diagnosis not present

## 2017-12-10 DIAGNOSIS — R5383 Other fatigue: Secondary | ICD-10-CM | POA: Diagnosis not present

## 2017-12-10 DIAGNOSIS — J4520 Mild intermittent asthma, uncomplicated: Secondary | ICD-10-CM | POA: Diagnosis not present

## 2017-12-10 DIAGNOSIS — E782 Mixed hyperlipidemia: Secondary | ICD-10-CM | POA: Diagnosis not present

## 2018-01-04 DIAGNOSIS — Z1231 Encounter for screening mammogram for malignant neoplasm of breast: Secondary | ICD-10-CM | POA: Diagnosis not present

## 2018-01-23 ENCOUNTER — Ambulatory Visit (AMBULATORY_SURGERY_CENTER): Payer: Self-pay | Admitting: *Deleted

## 2018-01-23 VITALS — Ht 62.0 in | Wt 129.0 lb

## 2018-01-23 DIAGNOSIS — Z86010 Personal history of colonic polyps: Secondary | ICD-10-CM

## 2018-01-23 DIAGNOSIS — Z800 Family history of malignant neoplasm of digestive organs: Secondary | ICD-10-CM

## 2018-01-23 MED ORDER — NA SULFATE-K SULFATE-MG SULF 17.5-3.13-1.6 GM/177ML PO SOLN: 1.0000 | Freq: Once | ORAL | 0 refills | Status: AC

## 2018-01-23 NOTE — Progress Notes (Signed)
Past hx of asthma attack with past sedation  30 + yrs ago  No egg or soy allergy known to patient  No issues with past sedation with any surgeries or procedures, no intubation problems  No diet pills per patient No home 02 use per patient  No blood thinners per patient  Pt states  issues with constipation  - she uses a stool softener prn only - if uses stool softener stools are soft otherwise are hard to medium- will instructed to use stool softener daily starting 5 days before  No A fib or A flutter  EMMI video sent to pt's e mail -= pt has no email  Husband in [place] today

## 2018-02-06 ENCOUNTER — Encounter: Payer: Self-pay | Admitting: Internal Medicine

## 2018-02-06 ENCOUNTER — Ambulatory Visit (AMBULATORY_SURGERY_CENTER): Payer: Medicare Other | Admitting: Internal Medicine

## 2018-02-06 VITALS — BP 132/69 | HR 63 | Temp 98.7°F | Resp 19 | Ht 63.0 in | Wt 134.0 lb

## 2018-02-06 DIAGNOSIS — J45909 Unspecified asthma, uncomplicated: Secondary | ICD-10-CM | POA: Diagnosis not present

## 2018-02-06 DIAGNOSIS — Z800 Family history of malignant neoplasm of digestive organs: Secondary | ICD-10-CM

## 2018-02-06 DIAGNOSIS — Z1211 Encounter for screening for malignant neoplasm of colon: Secondary | ICD-10-CM | POA: Diagnosis not present

## 2018-02-06 DIAGNOSIS — Z86010 Personal history of colonic polyps: Secondary | ICD-10-CM

## 2018-02-06 MED ORDER — SODIUM CHLORIDE 0.9 % IV SOLN: 500.0000 mL | Freq: Once | INTRAVENOUS | Status: DC

## 2018-02-06 NOTE — Patient Instructions (Signed)
YOU HAD AN ENDOSCOPIC PROCEDURE TODAY AT [facility]:   Refer to the procedure report that was given to you for any specific questions about what was found during the examination.  If the procedure report does not answer your questions, please call your gastroenterologist to clarify.  If you requested that your care partner not be given the details of your procedure findings, then the procedure report has been included in a sealed envelope for you to review at your convenience later.  YOU SHOULD EXPECT: Some feelings of bloating in the abdomen. Passage of more gas than usual.  Walking can help get rid of the air that was put into your GI tract during the procedure and reduce the bloating. If you had a lower endoscopy (such as a colonoscopy or flexible sigmoidoscopy) you may notice spotting of blood in your stool or on the toilet paper. If you underwent a bowel prep for your procedure, you may not have a normal bowel movement for a few days.  Please Note:  You might notice some irritation and congestion in your nose or some drainage.  This is from the oxygen used during your procedure.  There is no need for concern and it should clear up in a day or so.  SYMPTOMS TO REPORT IMMEDIATELY:   Following lower endoscopy (colonoscopy or flexible sigmoidoscopy):  Excessive amounts of blood in the stool  Significant tenderness or worsening of abdominal pains  Swelling of the abdomen that is new, acute  Fever of 100F or higher   For urgent or emergent issues, a gastroenterologist can be reached at any hour by calling [phone].   DIET:  We do recommend a small meal at first, but then you may proceed to your regular diet.  Drink plenty of fluids but you should avoid alcoholic beverages for 24 hours.  ACTIVITY:  You should plan to take it easy for the rest of today and you should NOT DRIVE or use heavy machinery until tomorrow (because of the sedation medicines used during the test).     FOLLOW UP: Our staff will call the number listed on your records the next business day following your procedure to check on you and address any questions or concerns that you may have regarding the information given to you following your procedure. If we do not reach you, we will leave a message.  However, if you are feeling well and you are not experiencing any problems, there is no need to return our call.  We will assume that you have returned to your regular daily activities without incident.  If any biopsies were taken you will be contacted by phone or by letter within the next 1-3 weeks.  Please call us at [phone] if you have not heard about the biopsies in 3 weeks.    SIGNATURES/CONFIDENTIALITY: You and/or your care partner have signed paperwork which will be entered into your electronic medical record.  These signatures attest to the fact that that the information above on your After Visit Summary has been reviewed and is understood.  Full responsibility of the confidentiality of this discharge information lies with you and/or your care-partner.     You may resume your current medications today. The office will call you with an appointment for a Virtual colonoscopy due to incomplete colon today. Please call if any questions or concerns.

## 2018-02-06 NOTE — Progress Notes (Signed)
No problems noted in the recovery room. maw 

## 2018-02-06 NOTE — Progress Notes (Signed)
Pt's states no medical or surgical changes since previsit or office visit. 

## 2018-02-06 NOTE — Op Note (Signed)
[facility] Patient Name: Latasha Schmidt Procedure Date: 02/06/2018 10:55 AM MRN: [id number] Endoscopist: Jerene Bears , MD Age: 72 Referring MD:  Date of Birth: 1945-05-16 Gender: Female Account #: [id number] Procedure:                Colonoscopy Indications:              High risk colon cancer surveillance: Personal                            history of colonic polyps, Family history of colon                            cancer in a first-degree relative, Last colonoscopy                            5 years ago Medicines:                Monitored Anesthesia Care Procedure:                Pre-Anesthesia Assessment:                           - Prior to the procedure, a History and Physical                            was performed, and patient medications and                            allergies were reviewed. The patient's tolerance of                            previous anesthesia was also reviewed. The risks                            and benefits of the procedure and the sedation                            options and risks were discussed with the patient.                            All questions were answered, and informed consent                            was obtained. Prior Anticoagulants: The patient has                            taken no previous anticoagulant or antiplatelet                            agents. ASA Grade Assessment: II - A patient with                            mild systemic disease. After reviewing the risks  and benefits, the patient was deemed in                            satisfactory condition to undergo the procedure.                           After obtaining informed consent, the colonoscope                            was passed under direct vision. Throughout the                            procedure, the patient's blood pressure, pulse, and                            oxygen saturations were monitored continuously. The                            Colonoscope was introduced through the anus with                            the intention of advancing to the cecum. The scope                            was advanced to the splenic flexure before the                            procedure was aborted. Despite using Pediatric                            colonoscope, position changes and counter-pressure                            the scope was unable to be advanced past a severe                            angulation near the splenic flexure. Medications                            were given. The colonoscopy was performed without                            difficulty. The colonoscopy was technically                            difficult and complex due to a tortuous colon. The                            patient tolerated the procedure well. The quality                            of the bowel preparation was good. The rectum was  photographed. Scope In: 11:32:23 AM Scope Out: 11:48:42 AM Total Procedure Duration: 0 hours 16 minutes 19 seconds  Findings:                 The digital rectal exam was normal.                           There was a small lipoma, in the descending colon.                           The exam was otherwise without abnormality on                            direct and retroflexion views. Complications:            No immediate complications. Estimated Blood Loss:     Estimated blood loss: none. Impression:               - Incomplete colonoscopy, exam to the splenic                            flexure. Limited by severe angulation near the                            splenic flexure.                           - Small lipoma in the descending colon.                           - The examination was otherwise normal on direct                            and retroflexion views.                           - No specimens collected. Recommendation:           - Patient has a contact  number available for                            emergencies. The signs and symptoms of potential                            delayed complications were discussed with the                            patient. Return to normal activities tomorrow.                            Written discharge instructions were provided to the                            patient.                           - Resume previous diet.                           -  Continue present medications.                           - Perform a virtual colonoscopy at appointment to                            be scheduled to complete screening (given                            incomplete colonoscopy today). Jerene Bears, MD 02/06/2018 11:59:18 AM This report has been signed electronically.

## 2018-02-06 NOTE — Progress Notes (Signed)
To recovery, report to RN, VSS. 

## 2018-02-07 ENCOUNTER — Telehealth: Payer: Self-pay

## 2018-02-07 ENCOUNTER — Other Ambulatory Visit: Payer: Self-pay

## 2018-02-07 DIAGNOSIS — Z1211 Encounter for screening for malignant neoplasm of colon: Secondary | ICD-10-CM

## 2018-02-07 NOTE — Telephone Encounter (Signed)
Pt scheduled for virtual colon at [facility] [address] 02/18/18@9am . Pt to pick up prep at that location before 02/13/18. Pt aware.

## 2018-02-07 NOTE — Telephone Encounter (Signed)
  Follow up Call-  Call back number 02/06/2018  Post procedure Call Back phone  # [phone]  Permission to leave phone message Yes  Some recent data might be hidden     Patient questions:  Do you have a fever, pain , or abdominal swelling? No. Pain Score  0 *  Have you tolerated food without any problems? Yes.    Have you been able to return to your normal activities? Yes.    Do you have any questions about your discharge instructions: Diet   No. Medications  No. Follow up visit  No.  Do you have questions or concerns about your Care? No.  Actions: * If pain score is 4 or above: No action needed, pain <4.

## 2018-02-18 ENCOUNTER — Ambulatory Visit
Admission: RE | Admit: 2018-02-18 | Discharge: 2018-02-18 | Disposition: A | Discharge status: Home / Self Care | Payer: Medicare Other | Source: Ambulatory Visit | Attending: Internal Medicine | Admitting: Internal Medicine

## 2018-02-18 DIAGNOSIS — Z1211 Encounter for screening for malignant neoplasm of colon: Secondary | ICD-10-CM

## 2018-02-18 DIAGNOSIS — K562 Volvulus: Secondary | ICD-10-CM | POA: Diagnosis not present

## 2018-06-13 ENCOUNTER — Telehealth: Payer: Self-pay

## 2018-06-13 NOTE — Telephone Encounter (Addendum)
Patient said she and her husband had a discussion and he wants her to start on Valtrex daily for suppression.  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  Her husband has many health issues and is afraid of being infected.  She said it is "causing an argument" because he wants her to take it. She would like to get a prescription for suppressive therapy.  Please note,  No visit since 01/03/2017.  She is scheduled for 01/06/2019.

## 2018-06-14 NOTE — Telephone Encounter (Signed)
Pl call and review ok to take suppression therapy BUT he has probably already been exposed and just has not had outbreaks or had a mild one and did not realize that was what it was.  Acyclovir is much cheaper if no or poor  insurance coverage for med/. Valtrex 500 mg daily or  Acyclovir is $4 at [employer] (with no insurance, cash price) for 30, acyclovir 200 mg daily wtth refills. Let me know if she wants me to call to discuss.

## 2018-06-14 NOTE — Telephone Encounter (Signed)
Tried to call patient several times. Sounds like her line may be out of order. I will try again on Monday.

## 2018-06-17 NOTE — Telephone Encounter (Signed)
Left message to call.

## 2018-06-18 MED ORDER — VALACYCLOVIR HCL 500 MG PO TABS: 500.0000 mg | ORAL_TABLET | Freq: Every day | ORAL | 2 refills | Status: DC

## 2018-06-18 NOTE — Telephone Encounter (Signed)
Patient advised about acyclovir being less expensive.  She wants to start with me send Valtrex as she has used before and if it is too costly she will call us back.

## 2018-12-23 DIAGNOSIS — Z7189 Other specified counseling: Secondary | ICD-10-CM | POA: Diagnosis not present

## 2018-12-23 DIAGNOSIS — J4520 Mild intermittent asthma, uncomplicated: Secondary | ICD-10-CM | POA: Diagnosis not present

## 2018-12-23 DIAGNOSIS — E782 Mixed hyperlipidemia: Secondary | ICD-10-CM | POA: Diagnosis not present

## 2018-12-23 DIAGNOSIS — Z780 Asymptomatic menopausal state: Secondary | ICD-10-CM | POA: Diagnosis not present

## 2018-12-23 DIAGNOSIS — Z0000 Encounter for general adult medical examination without abnormal findings: Secondary | ICD-10-CM | POA: Diagnosis not present

## 2018-12-23 DIAGNOSIS — R5383 Other fatigue: Secondary | ICD-10-CM | POA: Diagnosis not present

## 2018-12-30 DIAGNOSIS — Z800 Family history of malignant neoplasm of digestive organs: Secondary | ICD-10-CM | POA: Diagnosis not present

## 2018-12-30 DIAGNOSIS — J4520 Mild intermittent asthma, uncomplicated: Secondary | ICD-10-CM | POA: Diagnosis not present

## 2018-12-30 DIAGNOSIS — J301 Allergic rhinitis due to pollen: Secondary | ICD-10-CM | POA: Diagnosis not present

## 2018-12-30 DIAGNOSIS — E782 Mixed hyperlipidemia: Secondary | ICD-10-CM | POA: Diagnosis not present

## 2018-12-30 DIAGNOSIS — Z7189 Other specified counseling: Secondary | ICD-10-CM | POA: Diagnosis not present

## 2018-12-30 DIAGNOSIS — R5383 Other fatigue: Secondary | ICD-10-CM | POA: Diagnosis not present

## 2019-01-03 ENCOUNTER — Other Ambulatory Visit: Payer: Self-pay

## 2019-01-03 DIAGNOSIS — Z23 Encounter for immunization: Secondary | ICD-10-CM | POA: Diagnosis not present

## 2019-01-06 ENCOUNTER — Ambulatory Visit (INDEPENDENT_AMBULATORY_CARE_PROVIDER_SITE_OTHER): Payer: Medicare Other | Admitting: Women's Health

## 2019-01-06 ENCOUNTER — Encounter: Payer: Self-pay | Admitting: Women's Health

## 2019-01-06 ENCOUNTER — Other Ambulatory Visit: Payer: Self-pay

## 2019-01-06 VITALS — BP 158/80 | Ht 63.0 in | Wt 130.0 lb

## 2019-01-06 DIAGNOSIS — Z13820 Encounter for screening for osteoporosis: Secondary | ICD-10-CM

## 2019-01-06 DIAGNOSIS — B009 Herpesviral infection, unspecified: Secondary | ICD-10-CM | POA: Diagnosis not present

## 2019-01-06 DIAGNOSIS — Z780 Asymptomatic menopausal state: Secondary | ICD-10-CM

## 2019-01-06 DIAGNOSIS — Z9289 Personal history of other medical treatment: Secondary | ICD-10-CM | POA: Diagnosis not present

## 2019-01-06 DIAGNOSIS — Z01419 Encounter for gynecological examination (general) (routine) without abnormal findings: Secondary | ICD-10-CM

## 2019-01-06 DIAGNOSIS — E2839 Other primary ovarian failure: Secondary | ICD-10-CM

## 2019-01-06 MED ORDER — VALACYCLOVIR HCL 500 MG PO TABS: 500.0000 mg | ORAL_TABLET | Freq: Every day | ORAL | 2 refills | Status: AC

## 2019-01-06 NOTE — Progress Notes (Signed)
Latasha Schmidt [birth year] [id number]    History:    Presents for breast and pelvic exam with no complaints.  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  1992 TAH for menorrhagia and fibroids.  2019- colonoscopy.  2017 T score -2.1 FRAX 5% / 1%.  Primary care manages asthma.  Blood pressure slightly elevated today states 120/80 last week at primary care.  States having left shoulder arm pain after having flu vaccine 2 days ago.  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  Vaccines current.  Past medical history, past surgical history, family history and social history were all reviewed and documented in the EPIC chart.  2 children, husband having health problems with diabetes, heart disease.  Mother colon cancer at 15  ROS:  A ROS was performed and pertinent positives and negatives are included.  Exam:  Vitals:   01/06/19 1106  BP: (!) 158/80  Weight: 130 lb (59 kg)  Height: 5\' 3"  (1.6 m)   Body mass index is 23.03 kg/m.   General appearance:  Normal Thyroid:  Symmetrical, normal in size, without palpable masses or nodularity. Respiratory  Auscultation:  Clear without wheezing or rhonchi Cardiovascular  Auscultation:  Regular rate, without rubs, murmurs or gallops  Edema/varicosities:  Not grossly evident Abdominal  Soft,nontender, without masses, guarding or rebound.  Liver/spleen:  No organomegaly noted  Hernia:  None appreciated  Skin  Inspection:  Grossly normal   Breasts: Examined lying and sitting.     Right: Without masses, retractions, discharge or axillary adenopathy.     Left: Without masses, retractions, discharge or axillary adenopathy. Gentitourinary   Inguinal/mons:  Normal without inguinal adenopathy  External genitalia:  Normal  BUS/Urethra/Skene's glands:  Normal  Vagina:  Normal  Cervix: And uterus absent   Adnexa/parametria:     Rt: Without masses or tenderness.   Lt: Without masses or tenderness.  Anus and perineum: Normal  Digital rectal exam: Normal sphincter  tone without palpated masses or tenderness  Assessment/Plan:  73 y.o. MBF G2 P2 for breast and pelvic exam with no complaints.  1992 TAH for fibroids/menorrhagia on no HRT Asthma-primary care manages labs and meds Osteopenia without elevated FRAX  HSV-1  Plan: Repeat DEXA, will schedule.  Reviewed importance of continuing regular daily walking, encouraged balance type exercise such as yoga also.  Home safety, fall prevention discussed.  SBEs, continue annual screening mammogram, calcium rich foods, vitamin D 2000 daily encouraged.  Valtrex 500 twice daily for 3 to 5 days or as needed prescription, proper use given and reviewed.  Instructed to recheck blood pressure and if continues greater than 120/80 to follow-up with primary care.   Brought a copy of lab results from primary care - reviewed hemoglobin A1c slightly elevated at 5.9, reviewed importance of decreasing simple sugars/carbs, sweet tea and continue daily walking..  Reviewed other labs look good, reviewed importance of increasing plain water.    Morgan, 12:39 PM 01/06/2019

## 2019-01-06 NOTE — Patient Instructions (Signed)
Carbohydrate Counting for Diabetes Mellitus, Adult  Carbohydrate counting is a method of keeping track of how many carbohydrates you eat. Eating carbohydrates naturally increases the amount of sugar (glucose) in the blood. Counting how many carbohydrates you eat helps keep your blood glucose within normal limits, which helps you manage your diabetes (diabetes mellitus). It is important to know how many carbohydrates you can safely have in each meal. This is different for every person. A diet and nutrition specialist (registered dietitian) can help you make a meal plan and calculate how many carbohydrates you should have at each meal and snack. Carbohydrates are found in the following foods:  Grains, such as breads and cereals.  Dried beans and soy products.  Starchy vegetables, such as potatoes, peas, and corn.  Fruit and fruit juices.  Milk and yogurt.  Sweets and snack foods, such as cake, cookies, candy, chips, and soft drinks. How do I count carbohydrates? There are two ways to count carbohydrates in food. You can use either of the methods or a combination of both. Reading "Nutrition Facts" on packaged food The "Nutrition Facts" list is included on the labels of almost all packaged foods and beverages in the [place] It includes:  The serving size.  Information about nutrients in each serving, including the grams (g) of carbohydrate per serving. To use the "Nutrition Facts":  Decide how many servings you will have.  Multiply the number of servings by the number of carbohydrates per serving.  The resulting number is the total amount of carbohydrates that you will be having. Learning standard serving sizes of other foods When you eat carbohydrate foods that are not packaged or do not include "Nutrition Facts" on the label, you need to measure the servings in order to count the amount of carbohydrates:  Measure the foods that you will eat with a food scale or measuring cup, if needed.   Decide how many standard-size servings you will eat.  Multiply the number of servings by 15. Most carbohydrate-rich foods have about 15 g of carbohydrates per serving. ? For example, if you eat 8 oz (170 g) of strawberries, you will have eaten 2 servings and 30 g of carbohydrates (2 servings x 15 g = 30 g).  For foods that have more than one food mixed, such as soups and casseroles, you must count the carbohydrates in each food that is included. The following list contains standard serving sizes of common carbohydrate-rich foods. Each of these servings has about 15 g of carbohydrates:   hamburger bun or  English muffin.   oz (15 mL) syrup.   oz (14 g) jelly.  1 slice of bread.  1 six-inch tortilla.  3 oz (85 g) cooked rice or pasta.  4 oz (113 g) cooked dried beans.  4 oz (113 g) starchy vegetable, such as peas, corn, or potatoes.  4 oz (113 g) hot cereal.  4 oz (113 g) mashed potatoes or  of a large baked potato.  4 oz (113 g) canned or frozen fruit.  4 oz (120 mL) fruit juice.  4-6 crackers.  6 chicken nuggets.  6 oz (170 g) unsweetened dry cereal.  6 oz (170 g) plain fat-free yogurt or yogurt sweetened with artificial sweeteners.  8 oz (240 mL) milk.  8 oz (170 g) fresh fruit or one small piece of fruit.  24 oz (680 g) popped popcorn. Example of carbohydrate counting Sample meal  3 oz (85 g) chicken breast.  6 oz (170 g)   brown rice.  4 oz (113 g) corn.  8 oz (240 mL) milk.  8 oz (170 g) strawberries with sugar-free whipped topping. Carbohydrate calculation 1. Identify the foods that contain carbohydrates: ? Rice. ? Corn. ? Milk. ? Strawberries. 2. Calculate how many servings you have of each food: ? 2 servings rice. ? 1 serving corn. ? 1 serving milk. ? 1 serving strawberries. 3. Multiply each number of servings by 15 g: ? 2 servings rice x 15 g = 30 g. ? 1 serving corn x 15 g = 15 g. ? 1 serving milk x 15 g = 15 g. ? 1 serving  strawberries x 15 g = 15 g. 4. Add together all of the amounts to find the total grams of carbohydrates eaten: ? 30 g + 15 g + 15 g + 15 g = 75 g of carbohydrates total. Summary  Carbohydrate counting is a method of keeping track of how many carbohydrates you eat.  Eating carbohydrates naturally increases the amount of sugar (glucose) in the blood.  Counting how many carbohydrates you eat helps keep your blood glucose within normal limits, which helps you manage your diabetes.  A diet and nutrition specialist (registered dietitian) can help you make a meal plan and calculate how many carbohydrates you should have at each meal and snack. This information is not intended to replace advice given to you by your health care provider. Make sure you discuss any questions you have with your health care provider. Document Released: 04/24/2005 Document Revised: 11/16/2016 Document Reviewed: 10/06/2015 Elsevier Patient Education  2020 [employer] Health Maintenance After Age 65 After age 65, you are at a higher risk for certain long-term diseases and infections as well as injuries from falls. Falls are a major cause of broken bones and head injuries in people who are older than age 65. Getting regular preventive care can help to keep you healthy and well. Preventive care includes getting regular testing and making lifestyle changes as recommended by your health care provider. Talk with your health care provider about:  Which screenings and tests you should have. A screening is a test that checks for a disease when you have no symptoms.  A diet and exercise plan that is right for you. What should I know about screenings and tests to prevent falls? Screening and testing are the best ways to find a health problem early. Early diagnosis and treatment give you the best chance of managing medical conditions that are common after age 65. Certain conditions and lifestyle choices may make you more likely to  have a fall. Your health care provider may recommend:  Regular vision checks. Poor vision and conditions such as cataracts can make you more likely to have a fall. If you wear glasses, make sure to get your prescription updated if your vision changes.  Medicine review. Work with your health care provider to regularly review all of the medicines you are taking, including over-the-counter medicines. Ask your health care provider about any side effects that may make you more likely to have a fall. Tell your health care provider if any medicines that you take make you feel dizzy or sleepy.  Osteoporosis screening. Osteoporosis is a condition that causes the bones to get weaker. This can make the bones weak and cause them to break more easily.  Blood pressure screening. Blood pressure changes and medicines to control blood pressure can make you feel dizzy.  Strength and balance checks. Your health care provider may   recommend certain tests to check your strength and balance while standing, walking, or changing positions.  Foot health exam. Foot pain and numbness, as well as not wearing proper footwear, can make you more likely to have a fall.  Depression screening. You may be more likely to have a fall if you have a fear of falling, feel emotionally low, or feel unable to do activities that you used to do.  Alcohol use screening. Using too much alcohol can affect your balance and may make you more likely to have a fall. What actions can I take to lower my risk of falls? General instructions  Talk with your health care provider about your risks for falling. Tell your health care provider if: ? You fall. Be sure to tell your health care provider about all falls, even ones that seem minor. ? You feel dizzy, sleepy, or off-balance.  Take over-the-counter and prescription medicines only as told by your health care provider. These include any supplements.  Eat a healthy diet and maintain a healthy  weight. A healthy diet includes low-fat dairy products, low-fat (lean) meats, and fiber from whole grains, beans, and lots of fruits and vegetables. Home safety  Remove any tripping hazards, such as rugs, cords, and clutter.  Install safety equipment such as grab bars in bathrooms and safety rails on stairs.  Keep rooms and walkways well-lit. Activity   Follow a regular exercise program to stay fit. This will help you maintain your balance. Ask your health care provider what types of exercise are appropriate for you.  If you need a cane or walker, use it as recommended by your health care provider.  Wear supportive shoes that have nonskid soles. Lifestyle  Do not drink alcohol if your health care provider tells you not to drink.  If you drink alcohol, limit how much you have: ? 0-1 drink a day for women. ? 0-2 drinks a day for men.  Be aware of how much alcohol is in your drink. In the [place] one drink equals one typical bottle of beer (12 oz), one-half glass of wine (5 oz), or one shot of hard liquor (1 oz).  Do not use any products that contain nicotine or tobacco, such as cigarettes and e-cigarettes. If you need help quitting, ask your health care provider. Summary  Having a healthy lifestyle and getting preventive care can help to protect your health and wellness after age 65.  Screening and testing are the best way to find a health problem early and help you avoid having a fall. Early diagnosis and treatment give you the best chance for managing medical conditions that are more common for people who are older than age 65.  Falls are a major cause of broken bones and head injuries in people who are older than age 65. Take precautions to prevent a fall at home.  Work with your health care provider to learn what changes you can make to improve your health and wellness and to prevent falls. This information is not intended to replace advice given to you by your health care  provider. Make sure you discuss any questions you have with your health care provider. Document Released: 03/07/2017 Document Revised: 08/15/2018 Document Reviewed: 03/07/2017 Elsevier Patient Education  2020 [employer]  

## 2019-01-17 ENCOUNTER — Encounter: Payer: Self-pay | Admitting: Women's Health

## 2019-01-17 DIAGNOSIS — Z803 Family history of malignant neoplasm of breast: Secondary | ICD-10-CM | POA: Diagnosis not present

## 2019-01-17 DIAGNOSIS — Z1231 Encounter for screening mammogram for malignant neoplasm of breast: Secondary | ICD-10-CM | POA: Diagnosis not present

## 2019-01-28 ENCOUNTER — Other Ambulatory Visit: Payer: Self-pay

## 2019-01-30 ENCOUNTER — Other Ambulatory Visit: Payer: Self-pay

## 2019-01-30 ENCOUNTER — Ambulatory Visit (INDEPENDENT_AMBULATORY_CARE_PROVIDER_SITE_OTHER): Payer: Medicare Other

## 2019-01-30 DIAGNOSIS — M8589 Other specified disorders of bone density and structure, multiple sites: Secondary | ICD-10-CM

## 2019-01-30 DIAGNOSIS — Z780 Asymptomatic menopausal state: Secondary | ICD-10-CM | POA: Diagnosis not present

## 2019-01-30 DIAGNOSIS — E2839 Other primary ovarian failure: Secondary | ICD-10-CM

## 2019-01-31 ENCOUNTER — Other Ambulatory Visit: Payer: Self-pay | Admitting: Gynecology

## 2019-01-31 ENCOUNTER — Encounter: Payer: Self-pay | Admitting: Gynecology

## 2019-01-31 ENCOUNTER — Other Ambulatory Visit: Payer: Self-pay | Admitting: Women's Health

## 2019-01-31 DIAGNOSIS — M8589 Other specified disorders of bone density and structure, multiple sites: Secondary | ICD-10-CM

## 2019-01-31 DIAGNOSIS — Z780 Asymptomatic menopausal state: Secondary | ICD-10-CM

## 2019-02-12 ENCOUNTER — Encounter: Payer: Self-pay | Admitting: Gynecology

## 2019-02-17 ENCOUNTER — Telehealth: Payer: Self-pay

## 2019-02-17 NOTE — Telephone Encounter (Signed)
Patient called stating she had BD test 3 weeks ago and has not heard from results. I let her know TF typed letter 9/25 and Anderson Malta mailed in on 9/28 but I would be happy to resend it as well as read it to her. I read the letter to her and mailed her the result.

## 2019-07-03 DIAGNOSIS — Z23 Encounter for immunization: Secondary | ICD-10-CM | POA: Diagnosis not present

## 2019-07-31 DIAGNOSIS — Z23 Encounter for immunization: Secondary | ICD-10-CM | POA: Diagnosis not present

## 2020-01-06 DIAGNOSIS — Z800 Family history of malignant neoplasm of digestive organs: Secondary | ICD-10-CM | POA: Diagnosis not present

## 2020-01-06 DIAGNOSIS — R5383 Other fatigue: Secondary | ICD-10-CM | POA: Diagnosis not present

## 2020-01-06 DIAGNOSIS — Z0000 Encounter for general adult medical examination without abnormal findings: Secondary | ICD-10-CM | POA: Diagnosis not present

## 2020-01-06 DIAGNOSIS — J4520 Mild intermittent asthma, uncomplicated: Secondary | ICD-10-CM | POA: Diagnosis not present

## 2020-01-06 DIAGNOSIS — E782 Mixed hyperlipidemia: Secondary | ICD-10-CM | POA: Diagnosis not present

## 2020-01-13 DIAGNOSIS — J4520 Mild intermittent asthma, uncomplicated: Secondary | ICD-10-CM | POA: Diagnosis not present

## 2020-01-13 DIAGNOSIS — E782 Mixed hyperlipidemia: Secondary | ICD-10-CM | POA: Diagnosis not present

## 2020-01-13 DIAGNOSIS — Z800 Family history of malignant neoplasm of digestive organs: Secondary | ICD-10-CM | POA: Diagnosis not present

## 2020-01-13 DIAGNOSIS — J301 Allergic rhinitis due to pollen: Secondary | ICD-10-CM | POA: Diagnosis not present

## 2020-01-13 DIAGNOSIS — F419 Anxiety disorder, unspecified: Secondary | ICD-10-CM | POA: Diagnosis not present

## 2020-01-13 DIAGNOSIS — R5383 Other fatigue: Secondary | ICD-10-CM | POA: Diagnosis not present

## 2020-01-19 ENCOUNTER — Encounter: Payer: Self-pay | Admitting: Nurse Practitioner

## 2020-01-19 DIAGNOSIS — Z1231 Encounter for screening mammogram for malignant neoplasm of breast: Secondary | ICD-10-CM | POA: Diagnosis not present

## 2020-02-05 DIAGNOSIS — H43813 Vitreous degeneration, bilateral: Secondary | ICD-10-CM | POA: Diagnosis not present

## 2020-02-12 DIAGNOSIS — Z23 Encounter for immunization: Secondary | ICD-10-CM | POA: Diagnosis not present

## 2020-02-20 DIAGNOSIS — H2513 Age-related nuclear cataract, bilateral: Secondary | ICD-10-CM | POA: Diagnosis not present

## 2020-02-20 DIAGNOSIS — H40013 Open angle with borderline findings, low risk, bilateral: Secondary | ICD-10-CM | POA: Diagnosis not present

## 2020-02-20 DIAGNOSIS — H40051 Ocular hypertension, right eye: Secondary | ICD-10-CM | POA: Diagnosis not present

## 2020-02-20 DIAGNOSIS — H2511 Age-related nuclear cataract, right eye: Secondary | ICD-10-CM | POA: Diagnosis not present

## 2020-02-20 DIAGNOSIS — H25013 Cortical age-related cataract, bilateral: Secondary | ICD-10-CM | POA: Diagnosis not present

## 2020-02-27 DIAGNOSIS — Z23 Encounter for immunization: Secondary | ICD-10-CM | POA: Diagnosis not present

## 2020-03-17 DIAGNOSIS — H2511 Age-related nuclear cataract, right eye: Secondary | ICD-10-CM | POA: Diagnosis not present

## 2020-03-17 DIAGNOSIS — H25811 Combined forms of age-related cataract, right eye: Secondary | ICD-10-CM | POA: Diagnosis not present

## 2020-04-15 DIAGNOSIS — H2512 Age-related nuclear cataract, left eye: Secondary | ICD-10-CM | POA: Diagnosis not present

## 2020-04-15 DIAGNOSIS — H25012 Cortical age-related cataract, left eye: Secondary | ICD-10-CM | POA: Diagnosis not present

## 2020-06-09 DIAGNOSIS — H25812 Combined forms of age-related cataract, left eye: Secondary | ICD-10-CM | POA: Diagnosis not present

## 2020-06-09 DIAGNOSIS — H25012 Cortical age-related cataract, left eye: Secondary | ICD-10-CM | POA: Diagnosis not present

## 2020-06-09 DIAGNOSIS — H2512 Age-related nuclear cataract, left eye: Secondary | ICD-10-CM | POA: Diagnosis not present

## 2020-08-04 DIAGNOSIS — Z779 Other contact with and (suspected) exposures hazardous to health: Secondary | ICD-10-CM | POA: Diagnosis not present

## 2020-08-04 DIAGNOSIS — Z01419 Encounter for gynecological examination (general) (routine) without abnormal findings: Secondary | ICD-10-CM | POA: Diagnosis not present

## 2020-08-04 DIAGNOSIS — Z01411 Encounter for gynecological examination (general) (routine) with abnormal findings: Secondary | ICD-10-CM | POA: Diagnosis not present

## 2020-08-04 DIAGNOSIS — A6000 Herpesviral infection of urogenital system, unspecified: Secondary | ICD-10-CM | POA: Diagnosis not present

## 2020-08-04 DIAGNOSIS — Z124 Encounter for screening for malignant neoplasm of cervix: Secondary | ICD-10-CM | POA: Diagnosis not present

## 2020-08-04 DIAGNOSIS — B009 Herpesviral infection, unspecified: Secondary | ICD-10-CM | POA: Diagnosis not present

## 2020-08-04 DIAGNOSIS — Z6823 Body mass index (BMI) 23.0-23.9, adult: Secondary | ICD-10-CM | POA: Diagnosis not present

## 2020-10-11 DIAGNOSIS — H40013 Open angle with borderline findings, low risk, bilateral: Secondary | ICD-10-CM | POA: Diagnosis not present

## 2020-11-15 DIAGNOSIS — J4520 Mild intermittent asthma, uncomplicated: Secondary | ICD-10-CM | POA: Diagnosis not present

## 2020-11-15 DIAGNOSIS — Z889 Allergy status to unspecified drugs, medicaments and biological substances status: Secondary | ICD-10-CM | POA: Diagnosis not present

## 2020-11-15 DIAGNOSIS — J301 Allergic rhinitis due to pollen: Secondary | ICD-10-CM | POA: Diagnosis not present

## 2021-01-07 DIAGNOSIS — E782 Mixed hyperlipidemia: Secondary | ICD-10-CM | POA: Diagnosis not present

## 2021-01-07 DIAGNOSIS — Z0000 Encounter for general adult medical examination without abnormal findings: Secondary | ICD-10-CM | POA: Diagnosis not present

## 2021-01-07 DIAGNOSIS — R5383 Other fatigue: Secondary | ICD-10-CM | POA: Diagnosis not present

## 2021-01-14 DIAGNOSIS — R5383 Other fatigue: Secondary | ICD-10-CM | POA: Diagnosis not present

## 2021-01-14 DIAGNOSIS — E782 Mixed hyperlipidemia: Secondary | ICD-10-CM | POA: Diagnosis not present

## 2021-01-14 DIAGNOSIS — F419 Anxiety disorder, unspecified: Secondary | ICD-10-CM | POA: Diagnosis not present

## 2021-01-14 DIAGNOSIS — J301 Allergic rhinitis due to pollen: Secondary | ICD-10-CM | POA: Diagnosis not present

## 2021-01-14 DIAGNOSIS — J4520 Mild intermittent asthma, uncomplicated: Secondary | ICD-10-CM | POA: Diagnosis not present

## 2021-01-14 DIAGNOSIS — N182 Chronic kidney disease, stage 2 (mild): Secondary | ICD-10-CM | POA: Diagnosis not present

## 2021-01-14 DIAGNOSIS — Z23 Encounter for immunization: Secondary | ICD-10-CM | POA: Diagnosis not present

## 2021-01-31 DIAGNOSIS — J4540 Moderate persistent asthma, uncomplicated: Secondary | ICD-10-CM | POA: Diagnosis not present

## 2021-01-31 DIAGNOSIS — J301 Allergic rhinitis due to pollen: Secondary | ICD-10-CM | POA: Diagnosis not present

## 2021-01-31 DIAGNOSIS — T783XXD Angioneurotic edema, subsequent encounter: Secondary | ICD-10-CM | POA: Diagnosis not present

## 2021-01-31 DIAGNOSIS — J3081 Allergic rhinitis due to animal (cat) (dog) hair and dander: Secondary | ICD-10-CM | POA: Diagnosis not present

## 2021-02-03 DIAGNOSIS — Z1231 Encounter for screening mammogram for malignant neoplasm of breast: Secondary | ICD-10-CM | POA: Diagnosis not present

## 2021-04-13 DIAGNOSIS — Z23 Encounter for immunization: Secondary | ICD-10-CM | POA: Diagnosis not present

## 2021-04-18 DIAGNOSIS — Z961 Presence of intraocular lens: Secondary | ICD-10-CM | POA: Diagnosis not present

## 2021-04-18 DIAGNOSIS — H04123 Dry eye syndrome of bilateral lacrimal glands: Secondary | ICD-10-CM | POA: Diagnosis not present

## 2021-04-18 DIAGNOSIS — H40013 Open angle with borderline findings, low risk, bilateral: Secondary | ICD-10-CM | POA: Diagnosis not present

## 2021-04-18 DIAGNOSIS — H02831 Dermatochalasis of right upper eyelid: Secondary | ICD-10-CM | POA: Diagnosis not present

## 2021-09-15 DIAGNOSIS — Z124 Encounter for screening for malignant neoplasm of cervix: Secondary | ICD-10-CM | POA: Diagnosis not present

## 2021-09-15 DIAGNOSIS — Z01419 Encounter for gynecological examination (general) (routine) without abnormal findings: Secondary | ICD-10-CM | POA: Diagnosis not present

## 2021-09-15 DIAGNOSIS — Z0142 Encounter for cervical smear to confirm findings of recent normal smear following initial abnormal smear: Secondary | ICD-10-CM | POA: Diagnosis not present

## 2021-09-15 DIAGNOSIS — M810 Age-related osteoporosis without current pathological fracture: Secondary | ICD-10-CM | POA: Diagnosis not present

## 2021-09-15 DIAGNOSIS — Z01411 Encounter for gynecological examination (general) (routine) with abnormal findings: Secondary | ICD-10-CM | POA: Diagnosis not present

## 2021-09-15 DIAGNOSIS — Z90711 Acquired absence of uterus with remaining cervical stump: Secondary | ICD-10-CM | POA: Diagnosis not present

## 2021-09-19 DIAGNOSIS — J301 Allergic rhinitis due to pollen: Secondary | ICD-10-CM | POA: Diagnosis not present

## 2021-09-19 DIAGNOSIS — J4540 Moderate persistent asthma, uncomplicated: Secondary | ICD-10-CM | POA: Diagnosis not present

## 2021-09-19 DIAGNOSIS — J3081 Allergic rhinitis due to animal (cat) (dog) hair and dander: Secondary | ICD-10-CM | POA: Diagnosis not present

## 2021-09-19 DIAGNOSIS — T783XXA Angioneurotic edema, initial encounter: Secondary | ICD-10-CM | POA: Diagnosis not present

## 2021-10-17 DIAGNOSIS — H40013 Open angle with borderline findings, low risk, bilateral: Secondary | ICD-10-CM | POA: Diagnosis not present

## 2021-10-17 DIAGNOSIS — H04123 Dry eye syndrome of bilateral lacrimal glands: Secondary | ICD-10-CM | POA: Diagnosis not present

## 2022-01-19 DIAGNOSIS — J4520 Mild intermittent asthma, uncomplicated: Secondary | ICD-10-CM | POA: Diagnosis not present

## 2022-01-19 DIAGNOSIS — Z0000 Encounter for general adult medical examination without abnormal findings: Secondary | ICD-10-CM | POA: Diagnosis not present

## 2022-01-19 DIAGNOSIS — N182 Chronic kidney disease, stage 2 (mild): Secondary | ICD-10-CM | POA: Diagnosis not present

## 2022-01-19 DIAGNOSIS — E782 Mixed hyperlipidemia: Secondary | ICD-10-CM | POA: Diagnosis not present

## 2022-01-19 DIAGNOSIS — R5383 Other fatigue: Secondary | ICD-10-CM | POA: Diagnosis not present

## 2022-01-27 DIAGNOSIS — R5383 Other fatigue: Secondary | ICD-10-CM | POA: Diagnosis not present

## 2022-01-27 DIAGNOSIS — F419 Anxiety disorder, unspecified: Secondary | ICD-10-CM | POA: Diagnosis not present

## 2022-01-27 DIAGNOSIS — J4520 Mild intermittent asthma, uncomplicated: Secondary | ICD-10-CM | POA: Diagnosis not present

## 2022-01-27 DIAGNOSIS — E782 Mixed hyperlipidemia: Secondary | ICD-10-CM | POA: Diagnosis not present

## 2022-01-27 DIAGNOSIS — N182 Chronic kidney disease, stage 2 (mild): Secondary | ICD-10-CM | POA: Diagnosis not present

## 2022-01-27 DIAGNOSIS — J301 Allergic rhinitis due to pollen: Secondary | ICD-10-CM | POA: Diagnosis not present

## 2022-02-10 DIAGNOSIS — Z23 Encounter for immunization: Secondary | ICD-10-CM | POA: Diagnosis not present

## 2022-02-10 DIAGNOSIS — Z1231 Encounter for screening mammogram for malignant neoplasm of breast: Secondary | ICD-10-CM | POA: Diagnosis not present

## 2022-02-23 DIAGNOSIS — R922 Inconclusive mammogram: Secondary | ICD-10-CM | POA: Diagnosis not present

## 2022-02-23 DIAGNOSIS — R92321 Mammographic fibroglandular density, right breast: Secondary | ICD-10-CM | POA: Diagnosis not present

## 2022-08-02 DIAGNOSIS — H40022 Open angle with borderline findings, high risk, left eye: Secondary | ICD-10-CM | POA: Diagnosis not present

## 2022-08-02 DIAGNOSIS — H04123 Dry eye syndrome of bilateral lacrimal glands: Secondary | ICD-10-CM | POA: Diagnosis not present

## 2022-08-02 DIAGNOSIS — H26492 Other secondary cataract, left eye: Secondary | ICD-10-CM | POA: Diagnosis not present

## 2022-08-02 DIAGNOSIS — H40011 Open angle with borderline findings, low risk, right eye: Secondary | ICD-10-CM | POA: Diagnosis not present

## 2022-08-29 DIAGNOSIS — H40022 Open angle with borderline findings, high risk, left eye: Secondary | ICD-10-CM | POA: Diagnosis not present

## 2022-08-29 DIAGNOSIS — H40011 Open angle with borderline findings, low risk, right eye: Secondary | ICD-10-CM | POA: Diagnosis not present

## 2022-11-16 DIAGNOSIS — Z01411 Encounter for gynecological examination (general) (routine) with abnormal findings: Secondary | ICD-10-CM | POA: Diagnosis not present

## 2022-11-16 DIAGNOSIS — Z01419 Encounter for gynecological examination (general) (routine) without abnormal findings: Secondary | ICD-10-CM | POA: Diagnosis not present

## 2022-11-16 DIAGNOSIS — M8580 Other specified disorders of bone density and structure, unspecified site: Secondary | ICD-10-CM | POA: Diagnosis not present

## 2022-11-16 DIAGNOSIS — Z124 Encounter for screening for malignant neoplasm of cervix: Secondary | ICD-10-CM | POA: Diagnosis not present

## 2022-11-16 DIAGNOSIS — Z90711 Acquired absence of uterus with remaining cervical stump: Secondary | ICD-10-CM | POA: Diagnosis not present

## 2022-11-16 DIAGNOSIS — Z6823 Body mass index (BMI) 23.0-23.9, adult: Secondary | ICD-10-CM | POA: Diagnosis not present

## 2022-12-25 DIAGNOSIS — E349 Endocrine disorder, unspecified: Secondary | ICD-10-CM | POA: Diagnosis not present

## 2022-12-25 DIAGNOSIS — N958 Other specified menopausal and perimenopausal disorders: Secondary | ICD-10-CM | POA: Diagnosis not present

## 2022-12-25 DIAGNOSIS — M8588 Other specified disorders of bone density and structure, other site: Secondary | ICD-10-CM | POA: Diagnosis not present

## 2023-02-01 DIAGNOSIS — H40022 Open angle with borderline findings, high risk, left eye: Secondary | ICD-10-CM | POA: Diagnosis not present

## 2023-02-01 DIAGNOSIS — H40011 Open angle with borderline findings, low risk, right eye: Secondary | ICD-10-CM | POA: Diagnosis not present

## 2023-02-22 DIAGNOSIS — J4520 Mild intermittent asthma, uncomplicated: Secondary | ICD-10-CM | POA: Diagnosis not present

## 2023-02-22 DIAGNOSIS — E782 Mixed hyperlipidemia: Secondary | ICD-10-CM | POA: Diagnosis not present

## 2023-02-22 DIAGNOSIS — J301 Allergic rhinitis due to pollen: Secondary | ICD-10-CM | POA: Diagnosis not present

## 2023-02-22 DIAGNOSIS — N182 Chronic kidney disease, stage 2 (mild): Secondary | ICD-10-CM | POA: Diagnosis not present

## 2023-02-22 DIAGNOSIS — F419 Anxiety disorder, unspecified: Secondary | ICD-10-CM | POA: Diagnosis not present

## 2023-02-22 DIAGNOSIS — R5383 Other fatigue: Secondary | ICD-10-CM | POA: Diagnosis not present

## 2023-02-23 DIAGNOSIS — Z1231 Encounter for screening mammogram for malignant neoplasm of breast: Secondary | ICD-10-CM | POA: Diagnosis not present

## 2023-03-01 DIAGNOSIS — R5383 Other fatigue: Secondary | ICD-10-CM | POA: Diagnosis not present

## 2023-03-01 DIAGNOSIS — F419 Anxiety disorder, unspecified: Secondary | ICD-10-CM | POA: Diagnosis not present

## 2023-03-01 DIAGNOSIS — J4520 Mild intermittent asthma, uncomplicated: Secondary | ICD-10-CM | POA: Diagnosis not present

## 2023-03-01 DIAGNOSIS — N182 Chronic kidney disease, stage 2 (mild): Secondary | ICD-10-CM | POA: Diagnosis not present

## 2023-03-01 DIAGNOSIS — E782 Mixed hyperlipidemia: Secondary | ICD-10-CM | POA: Diagnosis not present

## 2023-03-01 DIAGNOSIS — Z23 Encounter for immunization: Secondary | ICD-10-CM | POA: Diagnosis not present

## 2023-03-01 DIAGNOSIS — J301 Allergic rhinitis due to pollen: Secondary | ICD-10-CM | POA: Diagnosis not present

## 2023-03-01 DIAGNOSIS — Z0000 Encounter for general adult medical examination without abnormal findings: Secondary | ICD-10-CM | POA: Diagnosis not present

## 2023-03-06 DIAGNOSIS — Z23 Encounter for immunization: Secondary | ICD-10-CM | POA: Diagnosis not present

## 2023-09-13 DIAGNOSIS — Z961 Presence of intraocular lens: Secondary | ICD-10-CM | POA: Diagnosis not present

## 2023-09-13 DIAGNOSIS — H40011 Open angle with borderline findings, low risk, right eye: Secondary | ICD-10-CM | POA: Diagnosis not present

## 2023-09-13 DIAGNOSIS — H26491 Other secondary cataract, right eye: Secondary | ICD-10-CM | POA: Diagnosis not present

## 2023-09-13 DIAGNOSIS — H524 Presbyopia: Secondary | ICD-10-CM | POA: Diagnosis not present

## 2023-09-13 DIAGNOSIS — H40022 Open angle with borderline findings, high risk, left eye: Secondary | ICD-10-CM | POA: Diagnosis not present

## 2024-01-16 DIAGNOSIS — M8580 Other specified disorders of bone density and structure, unspecified site: Secondary | ICD-10-CM | POA: Diagnosis not present

## 2024-01-16 DIAGNOSIS — Z124 Encounter for screening for malignant neoplasm of cervix: Secondary | ICD-10-CM | POA: Diagnosis not present

## 2024-01-16 DIAGNOSIS — Z1331 Encounter for screening for depression: Secondary | ICD-10-CM | POA: Diagnosis not present

## 2024-02-29 DIAGNOSIS — Z1231 Encounter for screening mammogram for malignant neoplasm of breast: Secondary | ICD-10-CM | POA: Diagnosis not present

## 2024-03-06 DIAGNOSIS — F419 Anxiety disorder, unspecified: Secondary | ICD-10-CM | POA: Diagnosis not present

## 2024-03-06 DIAGNOSIS — J301 Allergic rhinitis due to pollen: Secondary | ICD-10-CM | POA: Diagnosis not present

## 2024-03-06 DIAGNOSIS — R5383 Other fatigue: Secondary | ICD-10-CM | POA: Diagnosis not present

## 2024-03-06 DIAGNOSIS — J4520 Mild intermittent asthma, uncomplicated: Secondary | ICD-10-CM | POA: Diagnosis not present

## 2024-03-06 DIAGNOSIS — N182 Chronic kidney disease, stage 2 (mild): Secondary | ICD-10-CM | POA: Diagnosis not present

## 2024-03-06 DIAGNOSIS — E782 Mixed hyperlipidemia: Secondary | ICD-10-CM | POA: Diagnosis not present

## 2024-03-13 DIAGNOSIS — J4520 Mild intermittent asthma, uncomplicated: Secondary | ICD-10-CM | POA: Diagnosis not present

## 2024-03-13 DIAGNOSIS — R5383 Other fatigue: Secondary | ICD-10-CM | POA: Diagnosis not present

## 2024-03-13 DIAGNOSIS — N182 Chronic kidney disease, stage 2 (mild): Secondary | ICD-10-CM | POA: Diagnosis not present

## 2024-03-13 DIAGNOSIS — Z23 Encounter for immunization: Secondary | ICD-10-CM | POA: Diagnosis not present

## 2024-03-13 DIAGNOSIS — J301 Allergic rhinitis due to pollen: Secondary | ICD-10-CM | POA: Diagnosis not present

## 2024-03-13 DIAGNOSIS — F419 Anxiety disorder, unspecified: Secondary | ICD-10-CM | POA: Diagnosis not present

## 2024-03-13 DIAGNOSIS — Z0000 Encounter for general adult medical examination without abnormal findings: Secondary | ICD-10-CM | POA: Diagnosis not present

## 2024-03-13 DIAGNOSIS — E782 Mixed hyperlipidemia: Secondary | ICD-10-CM | POA: Diagnosis not present

## 2024-03-18 DIAGNOSIS — Z23 Encounter for immunization: Secondary | ICD-10-CM | POA: Diagnosis not present
